# Patient Record
Sex: Female | Born: 1995 | Race: Black or African American | Hispanic: No | Marital: Single | Smoking: Current every day smoker
Health system: Southern US, Community
[De-identification: ages and names within clinical notes are randomized; demographics above are authoritative.]

## PROBLEM LIST (undated history)

## (undated) ENCOUNTER — Inpatient Hospital Stay (HOSPITAL_COMMUNITY): Payer: Self-pay

## (undated) ENCOUNTER — Ambulatory Visit: Payer: Commercial Managed Care - HMO | Source: Home / Self Care

## (undated) DIAGNOSIS — I10 Essential (primary) hypertension: Secondary | ICD-10-CM

## (undated) HISTORY — PX: WISDOM TOOTH EXTRACTION: SHX21

---
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
## 2013-06-01 ENCOUNTER — Emergency Department (HOSPITAL_COMMUNITY)
Admission: EM | Admit: 2013-06-01 | Discharge: 2013-06-01 | Disposition: A | Discharge status: Home / Self Care | Payer: Medicaid Other | Attending: Emergency Medicine | Admitting: Emergency Medicine

## 2013-06-01 ENCOUNTER — Encounter (HOSPITAL_COMMUNITY): Payer: Self-pay | Admitting: Emergency Medicine

## 2013-06-01 DIAGNOSIS — I10 Essential (primary) hypertension: Secondary | ICD-10-CM | POA: Insufficient documentation

## 2013-06-01 DIAGNOSIS — R0789 Other chest pain: Secondary | ICD-10-CM

## 2013-06-01 DIAGNOSIS — Y939 Activity, unspecified: Secondary | ICD-10-CM | POA: Insufficient documentation

## 2013-06-01 DIAGNOSIS — Z87891 Personal history of nicotine dependence: Secondary | ICD-10-CM | POA: Insufficient documentation

## 2013-06-01 DIAGNOSIS — T07XXXA Unspecified multiple injuries, initial encounter: Secondary | ICD-10-CM | POA: Insufficient documentation

## 2013-06-01 MED ORDER — CYCLOBENZAPRINE HCL 10 MG PO TABS: 10.0000 mg | ORAL_TABLET | Freq: Two times a day (BID) | ORAL | Status: DC | PRN

## 2013-06-01 MED ORDER — NAPROXEN 500 MG PO TABS: 500.0000 mg | ORAL_TABLET | Freq: Two times a day (BID) | ORAL | Status: DC

## 2013-06-01 NOTE — ED Notes (Signed)
Pt alert and oriented and talking on cell phone when this RN entered room.  Pt states she's in so much pain she couldn't even go to school today.  Pt ambulatory without difficulty or assistance.
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
## 2013-06-01 NOTE — Discharge Instructions (Signed)
Take Naprosyn as needed for pain. Take Flexeril as needed for muscle spasm. Refer to attached documents for more information. Return to the ED with worsening or concerning symptoms.  °

## 2013-06-02 NOTE — ED Provider Notes (Signed)
Medical screening examination/treatment/procedure(s) were performed by non-physician practitioner and as supervising physician I was immediately available for consultation/collaboration.   EKG Interpretation None        Layla MawKristen N Jamela Cumbo, DO 06/02/13 [phone]

## 2013-12-13 ENCOUNTER — Encounter (HOSPITAL_COMMUNITY): Payer: Self-pay | Admitting: Emergency Medicine

## 2014-02-11 NOTE — L&D Delivery Note (Signed)
Delivery Note  Patient with pregnancy complicated by cHTN (on asa prophylaxis), obesity, limited PNC and fLeft renal anomaly, presented at 9363w2d with PROM at 0620 12/03/14. She was not feeling contractions and underwent induction of labor with cytotec and FB placement. Labor was augmented with pitocin. She was dilated to completion at approximately 0015 on 12/04/14, and patient began pushing.   At 12:28 AM a viable and healthy female was delivered via Vaginal, Spontaneous Delivery (Presentation: Left Occiput Anterior).  APGAR: pending; weight 6 lb 15.8 oz (3170 g).   Placenta status: intact.  Cord: 3 vessels with the following complications: Loose nuchal cord.  Cord pH: N/A  Anesthesia: Epidural  Episiotomy: None Lacerations: None Suture Repair: None Est. Blood Loss (mL): 400  Mom to postpartum.  Baby to Couplet care / Skin to Skin.  Jamelle HaringHillary M Jaeline Whobrey, MD [facility], PGY-1 12/04/2014, 12:42 AM

## 2014-05-12 ENCOUNTER — Encounter (HOSPITAL_COMMUNITY): Payer: Self-pay | Admitting: Emergency Medicine

## 2014-05-12 ENCOUNTER — Emergency Department (HOSPITAL_COMMUNITY)
Admission: EM | Admit: 2014-05-12 | Discharge: 2014-05-12 | Disposition: A | Discharge status: Home / Self Care | Payer: Medicaid Other | Attending: Emergency Medicine | Admitting: Emergency Medicine

## 2014-05-12 DIAGNOSIS — I10 Essential (primary) hypertension: Secondary | ICD-10-CM | POA: Insufficient documentation

## 2014-05-12 DIAGNOSIS — R110 Nausea: Secondary | ICD-10-CM | POA: Diagnosis not present

## 2014-05-12 DIAGNOSIS — Z3202 Encounter for pregnancy test, result negative: Secondary | ICD-10-CM | POA: Diagnosis not present

## 2014-05-12 DIAGNOSIS — Z87891 Personal history of nicotine dependence: Secondary | ICD-10-CM | POA: Insufficient documentation

## 2014-05-12 DIAGNOSIS — N926 Irregular menstruation, unspecified: Secondary | ICD-10-CM | POA: Diagnosis not present

## 2014-05-12 LAB — URINALYSIS, ROUTINE W REFLEX MICROSCOPIC
BILIRUBIN URINE: NEGATIVE
Glucose, UA: NEGATIVE mg/dL
HGB URINE DIPSTICK: NEGATIVE
KETONES UR: NEGATIVE mg/dL
LEUKOCYTES UA: NEGATIVE
NITRITE: NEGATIVE
Protein, ur: NEGATIVE mg/dL
Specific Gravity, Urine: 1.014 (ref 1.005–1.030)
UROBILINOGEN UA: 0.2 mg/dL (ref 0.0–1.0)
pH: 7 (ref 5.0–8.0)

## 2014-05-12 LAB — POC URINE PREG, ED: Preg Test, Ur: NEGATIVE

## 2014-05-12 NOTE — ED Provider Notes (Signed)
CSN: [id number]     Arrival date & time 05/12/14  1958 History   First MD Initiated Contact with Patient 05/12/14 2141     Chief Complaint  Patient presents with  . Morning Sickness  . Possible Pregnancy     (Consider location/radiation/quality/duration/timing/severity/associated sxs/prior Treatment) HPI Alicia Simon is a 19 y.o. female with hx of htn, presents to ED with complaint of nausea, weakness, sensetivity to smell and taste, missed last two menstrual cycles. Pt states symptoms began several wks ago. States she thought she was pregnant, took preg test at home 2 wks ago and it was negative. Pt denies any fever or chills. No vomiting. No pelvic pain. No abdominal pain. No urinary symptoms. States last time she had these symptoms she ws pregnant. Pt denies any other complaints.    Past Medical History  Diagnosis Date  . Hypertension    Past Surgical History  Procedure Laterality Date  . Wisdom tooth extraction     History reviewed. No pertinent family history. History  Substance Use Topics  . Smoking status: Former Smoker -- .5 years    Types: Cigarettes  . Smokeless tobacco: Never Used  . Alcohol Use: No   OB History    Gravida Para Term Preterm AB TAB SAB Ectopic Multiple Living   0 0 0 0 0 0 1     Review of Systems  Constitutional: Negative for fever and chills.  Respiratory: Negative for cough, chest tightness and shortness of breath.   Cardiovascular: Negative for chest pain, palpitations and leg swelling.  Gastrointestinal: Positive for nausea. Negative for vomiting, abdominal pain and diarrhea.  Genitourinary: Positive for menstrual problem. Negative for dysuria, flank pain, vaginal bleeding, vaginal discharge, vaginal pain and pelvic pain.  Musculoskeletal: Negative for myalgias, arthralgias, neck pain and neck stiffness.  Skin: Negative for rash.  Neurological: Negative for dizziness, weakness and headaches.  All other systems reviewed and are  negative.     Allergies  Review of patient's allergies indicates no known allergies.  Home Medications   Prior to Admission medications   Medication Sig Start Date End Date Taking? Authorizing Provider  cyclobenzaprine (FLEXERIL) 10 MG tablet Take 1 tablet (10 mg total) by mouth 2 (two) times daily as needed for muscle spasms. Patient not taking: Reported on 05/12/2014 06/01/13   Emilia Beck, PA-C  naproxen (NAPROSYN) 500 MG tablet Take 1 tablet (500 mg total) by mouth 2 (two) times daily with a meal. Patient not taking: Reported on 05/12/2014 06/01/13   Emilia Beck, PA-C   BP 134/77 mmHg  Pulse 90  Temp(Src) 99 F (37.2 C)  Resp 18  SpO2 99%  LMP 02/14/2014 (Exact Date) Physical Exam  Constitutional: She is oriented to person, place, and time. She appears well-developed and well-nourished. No distress.  HENT:  Head: Normocephalic.  Eyes: Conjunctivae are normal.  Neck: Neck supple.  Cardiovascular: Normal rate, regular rhythm and normal heart sounds.   Pulmonary/Chest: Effort normal and breath sounds normal. No respiratory distress. She has no wheezes. She has no rales.  Abdominal: Soft. Bowel sounds are normal. She exhibits no distension. There is no tenderness. There is no rebound.  Musculoskeletal: She exhibits no edema.  Neurological: She is alert and oriented to person, place, and time.  Skin: Skin is warm and dry.  Psychiatric: She has a normal mood and affect. Her behavior is normal.  Nursing note and vitals reviewed.   ED Course  Procedures (including critical care time) Labs Review  Labs Reviewed  URINALYSIS, ROUTINE W REFLEX MICROSCOPIC  POC URINE PREG, ED  I-STAT BETA HCG BLOOD, ED ([facility], [facility], [facility] ONLY)    Imaging Review No results found.   EKG Interpretation None      MDM   Final diagnoses:  Irregular menses  Nausea   Pt is here for "second opinion because I think I am pregnant, but my pregnancy test at home was negative." Pt denies any  pain. Abdomen is benign. VS normal. Urine and urine preg both negative. Pt requesting blood test. Will get POC Hcg.   Pt decided she did not want to wait on hCg. Will d/c home.   Filed Vitals:   05/12/14 2014 05/12/14 2225  BP: 134/77 128/84  Pulse: 90 92  Temp: 99 F (37.2 C) 98.3 F (36.8 C)  TempSrc:  Oral  Resp: 18 16  SpO2: 99% 100%       Jaynie Crumbleatyana Jadalyn Oliveri, PA-C 05/13/14 0023  Linwood DibblesJon Knapp, MD 05/14/14 [phone]

## 2014-05-12 NOTE — Discharge Instructions (Signed)
Your urine pregnancy test here is negative. Please follow up with OB/GYN or primary care doctor of your choice for further evaluation.

## 2014-05-12 NOTE — ED Notes (Signed)
Pt refused blood draw

## 2014-05-12 NOTE — ED Notes (Signed)
Patient here with complaint of morning sickness, sensitivity to smells, and loss of period. Expresses feeling like she may be pregnant, but took a home pregnancy test 2 weeks ago and it was negative.

## 2014-07-10 ENCOUNTER — Emergency Department (HOSPITAL_COMMUNITY)
Admission: EM | Admit: 2014-07-10 | Discharge: 2014-07-11 | Disposition: A | Discharge status: Home / Self Care | Payer: No Typology Code available for payment source | Attending: Emergency Medicine | Admitting: Emergency Medicine

## 2014-07-10 ENCOUNTER — Encounter (HOSPITAL_COMMUNITY): Payer: Self-pay | Admitting: Emergency Medicine

## 2014-07-10 DIAGNOSIS — Z3A16 16 weeks gestation of pregnancy: Secondary | ICD-10-CM | POA: Insufficient documentation

## 2014-07-10 DIAGNOSIS — Y92830 Public park as the place of occurrence of the external cause: Secondary | ICD-10-CM | POA: Diagnosis not present

## 2014-07-10 DIAGNOSIS — F17210 Nicotine dependence, cigarettes, uncomplicated: Secondary | ICD-10-CM | POA: Diagnosis not present

## 2014-07-10 DIAGNOSIS — Z79899 Other long term (current) drug therapy: Secondary | ICD-10-CM | POA: Diagnosis not present

## 2014-07-10 DIAGNOSIS — S8002XA Contusion of left knee, initial encounter: Secondary | ICD-10-CM | POA: Diagnosis not present

## 2014-07-10 DIAGNOSIS — Y998 Other external cause status: Secondary | ICD-10-CM | POA: Insufficient documentation

## 2014-07-10 DIAGNOSIS — O9A212 Injury, poisoning and certain other consequences of external causes complicating pregnancy, second trimester: Secondary | ICD-10-CM | POA: Insufficient documentation

## 2014-07-10 DIAGNOSIS — S39012A Strain of muscle, fascia and tendon of lower back, initial encounter: Secondary | ICD-10-CM

## 2014-07-10 DIAGNOSIS — O10012 Pre-existing essential hypertension complicating pregnancy, second trimester: Secondary | ICD-10-CM | POA: Insufficient documentation

## 2014-07-10 DIAGNOSIS — S301XXA Contusion of abdominal wall, initial encounter: Secondary | ICD-10-CM | POA: Diagnosis not present

## 2014-07-10 DIAGNOSIS — Y9389 Activity, other specified: Secondary | ICD-10-CM | POA: Insufficient documentation

## 2014-07-10 DIAGNOSIS — O99332 Smoking (tobacco) complicating pregnancy, second trimester: Secondary | ICD-10-CM | POA: Diagnosis not present

## 2014-07-10 DIAGNOSIS — Z3490 Encounter for supervision of normal pregnancy, unspecified, unspecified trimester: Secondary | ICD-10-CM
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
## 2014-07-11 ENCOUNTER — Encounter (HOSPITAL_COMMUNITY): Payer: Self-pay | Admitting: *Deleted

## 2014-07-11 MED ORDER — ACETAMINOPHEN 325 MG PO TABS
650.0000 mg | ORAL_TABLET | Freq: Once | ORAL | Status: AC
  Administered 2014-07-11: 650 mg via ORAL
  Filled 2014-07-11: qty 2
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
## 2014-07-11 NOTE — Discharge Instructions (Signed)
Back Exercises Back exercises help treat and prevent back injuries. The goal is to increase your strength in your belly (abdominal) and back muscles. These exercises can also help with flexibility. Start these exercises when told by your doctor. HOME CARE Back exercises include: Pelvic Tilt.  Lie on your back with your knees bent. Tilt your pelvis until the lower part of your back is against the floor. Hold this position 5 to 10 sec. Repeat this exercise 5 to 10 times. Knee to Chest.  Pull 1 knee up against your chest and hold for 20 to 30 seconds. Repeat this with the other knee. This may be done with the other leg straight or bent, whichever feels better. Then, pull both knees up against your chest. Sit-Ups or Curl-Ups.  Bend your knees 90 degrees. Start with tilting your pelvis, and do a partial, slow sit-up. Only lift your upper half 30 to 45 degrees off the floor. Take at least 2 to 3 seonds for each sit-up. Do not do sit-ups with your knees out straight. If partial sit-ups are difficult, simply do the above but with only tightening your belly (abdominal) muscles and holding it as told. Hip-Lift.  Lie on your back with your knees flexed 90 degrees. Push down with your feet and shoulders as you raise your hips 2 inches off the floor. Hold for 10 seconds, repeat 5 to 10 times. Back Arches.  Lie on your stomach. Prop yourself up on bent elbows. Slowly press on your hands, causing an arch in your low back. Repeat 3 to 5 times. Shoulder-Lifts.  Lie face down with arms beside your body. Keep hips and belly pressed to floor as you slowly lift your head and shoulders off the floor. Do not overdo your exercises. Be careful in the beginning. Exercises may cause you some mild back discomfort. If the pain lasts for more than 15 minutes, stop the exercises until you see your doctor. Improvement with exercise for back problems is slow.  Document Released: 03/02/2010 Document Revised: 04/22/2011  Document Reviewed: 11/29/2010 [facility] Patient Information 2015 [employer] [place]. This information is not intended to replace advice given to you by your health care provider. Make sure you discuss any questions you have with your health care provider. He can safely take Tylenol for your discomfort in your pregnant during her pregnancy.  [facility]  Please make an appointment to start her OB/GYN care as soon as possible if he develops new or worsening symptoms in your abdomen.  Develop vaginal bleeding or discharge.  [facility]

## 2014-07-11 NOTE — ED Notes (Signed)
FHT verified with bedside [place]

## 2014-07-19 ENCOUNTER — Inpatient Hospital Stay (HOSPITAL_COMMUNITY): Payer: Medicaid Other

## 2014-07-19 ENCOUNTER — Encounter (HOSPITAL_COMMUNITY): Payer: Self-pay | Admitting: *Deleted

## 2014-07-19 ENCOUNTER — Inpatient Hospital Stay (HOSPITAL_COMMUNITY)
Admission: AD | Admit: 2014-07-19 | Discharge: 2014-07-19 | Disposition: A | Discharge status: Home / Self Care | Payer: Medicaid Other | Source: Ambulatory Visit | Attending: Family Medicine | Admitting: Family Medicine

## 2014-07-19 DIAGNOSIS — R109 Unspecified abdominal pain: Secondary | ICD-10-CM | POA: Insufficient documentation

## 2014-07-19 DIAGNOSIS — O9989 Other specified diseases and conditions complicating pregnancy, childbirth and the puerperium: Secondary | ICD-10-CM | POA: Insufficient documentation

## 2014-07-19 DIAGNOSIS — Z3A00 Weeks of gestation of pregnancy not specified: Secondary | ICD-10-CM | POA: Diagnosis not present

## 2014-07-19 DIAGNOSIS — O99330 Smoking (tobacco) complicating pregnancy, unspecified trimester: Secondary | ICD-10-CM | POA: Insufficient documentation

## 2014-07-19 DIAGNOSIS — O26899 Other specified pregnancy related conditions, unspecified trimester: Secondary | ICD-10-CM

## 2014-07-19 DIAGNOSIS — F17210 Nicotine dependence, cigarettes, uncomplicated: Secondary | ICD-10-CM | POA: Diagnosis not present

## 2014-07-19 LAB — URINALYSIS, ROUTINE W REFLEX MICROSCOPIC
BILIRUBIN URINE: NEGATIVE
GLUCOSE, UA: NEGATIVE mg/dL
Hgb urine dipstick: NEGATIVE
Ketones, ur: NEGATIVE mg/dL
LEUKOCYTES UA: NEGATIVE
Nitrite: NEGATIVE
PROTEIN: NEGATIVE mg/dL
Specific Gravity, Urine: 1.02 (ref 1.005–1.030)
Urobilinogen, UA: 0.2 mg/dL (ref 0.0–1.0)
pH: 6 (ref 5.0–8.0)

## 2014-07-19 LAB — WET PREP, GENITAL
TRICH WET PREP: NONE SEEN
Yeast Wet Prep HPF POC: NONE SEEN

## 2014-07-19 LAB — CBC
HCT: 32.1 % — ABNORMAL LOW (ref 36.0–46.0)
HEMOGLOBIN: 10.7 g/dL — AB (ref 12.0–15.0)
MCH: 27.3 pg (ref 26.0–34.0)
MCHC: 33.3 g/dL (ref 30.0–36.0)
MCV: 81.9 fL (ref 78.0–100.0)
Platelets: 226 10*3/uL (ref 150–400)
RBC: 3.92 MIL/uL (ref 3.87–5.11)
RDW: 16.5 % — ABNORMAL HIGH (ref 11.5–15.5)
WBC: 6.5 10*3/uL (ref 4.0–10.5)

## 2014-07-19 LAB — GC/CHLAMYDIA PROBE AMP (~~LOC~~) NOT AT ARMC
CHLAMYDIA, DNA PROBE: NEGATIVE
Neisseria Gonorrhea: NEGATIVE

## 2014-07-19 LAB — HIV ANTIBODY (ROUTINE TESTING W REFLEX): HIV Screen 4th Generation wRfx: NONREACTIVE

## 2014-07-19 LAB — POCT PREGNANCY, URINE: Preg Test, Ur: POSITIVE — AB

## 2014-07-19 LAB — HCG, QUANTITATIVE, PREGNANCY: HCG, BETA CHAIN, QUANT, S: 8499 m[IU]/mL — AB (ref <5)

## 2014-07-19 NOTE — MAU Provider Note (Signed)
History     CSN: [id number]  Arrival date and time: 07/19/14 0003   First Provider Initiated Contact with Patient 07/19/14 0101      No chief complaint on file.  HPI Comments: Alicia Simon is a 19 y.o. G2P1001 at Unknown who presents today with abdominal pain x 1 week. She states that she had a [facility] that was [facility], and then several weeks later she had a + at the [facility]. She denies any VB. She has an appointment in the clinic on 07/26/14 to start [facility].   Abdominal Pain This is a new problem. The current episode started in the past 7 days. The onset quality is gradual. The problem occurs constantly. The problem has been unchanged. The pain is located in the generalized abdominal region. The pain is at a severity of 8/10. The quality of the pain is cramping. The abdominal pain does not radiate. Pertinent negatives include no constipation, diarrhea, dysuria, fever, frequency, nausea or vomiting. Nothing aggravates the pain. The pain is relieved by nothing. She has tried nothing for the symptoms.     Past Medical History  Diagnosis Date  . Hypertension     Past Surgical History  Procedure Laterality Date  . Wisdom tooth extraction      History reviewed. No pertinent family history.  History  Substance Use Topics  . Smoking status: Current Every Day Smoker -- .5 years    Types: Cigarettes  . Smokeless tobacco: Never Used  . Alcohol Use: No    Allergies: No Known Allergies  Prescriptions prior to admission  Medication Sig Dispense Refill Last Dose  . Prenatal Vit-Fe Fumarate-FA (PRENATAL MULTIVITAMIN) TABS tablet Take 1 tablet by mouth daily at 12 noon.   07/18/2014 at Unknown time  . cyclobenzaprine (FLEXERIL) 10 MG tablet Take 1 tablet (10 mg total) by mouth 2 (two) times daily as needed for muscle spasms. (Patient not taking: Reported on 05/12/2014) 8 tablet 0 Not Taking at Unknown time  . naproxen (NAPROSYN) 500 MG tablet Take 1 tablet (500 mg total)  by mouth 2 (two) times daily with a meal. (Patient not taking: Reported on 05/12/2014) 30 tablet 0 Not Taking at Unknown time    Review of Systems  Constitutional: Negative for fever.  Gastrointestinal: Positive for abdominal pain. Negative for nausea, vomiting, diarrhea and constipation.  Genitourinary: Negative for dysuria, urgency and frequency.   Physical Exam   Blood pressure 136/75, pulse 97, temperature 98.6 F (37 C), temperature source Oral, resp. rate 18, height 5' 3.5" (1.613 m), weight 119.75 kg (264 lb), last menstrual period 02/14/2014, SpO2 100 %.  Physical Exam  Nursing note and vitals reviewed. Constitutional: She is oriented to person, place, and time. She appears well-developed and well-nourished. No distress.  Cardiovascular: Normal rate.   Respiratory: Effort normal.  GI: Soft. There is no tenderness. There is no rebound.  Genitourinary:  External: no lesion Vagina: small amount of white discharge Cervix: pink, smooth, no CMT Uterus: NSSC Adnexa: NT   Neurological: She is oriented to person, place, and time.  Skin: Skin is warm and dry.  Psychiatric: She has a normal mood and affect.   Results for orders placed or performed during the hospital encounter of 07/19/14 (from the past 24 hour(s))  Urinalysis, Routine w reflex microscopic (not at [facility])     Status: None   Collection Time: 07/19/14 12:20 AM  Result Value Ref Range   Color, Urine YELLOW YELLOW   APPearance CLEAR  CLEAR   Specific Gravity, Urine 1.020 1.005 - 1.030   pH 6.0 5.0 - 8.0   Glucose, UA NEGATIVE NEGATIVE mg/dL   Hgb urine dipstick NEGATIVE NEGATIVE   Bilirubin Urine NEGATIVE NEGATIVE   Ketones, ur NEGATIVE NEGATIVE mg/dL   Protein, ur NEGATIVE NEGATIVE mg/dL   Urobilinogen, UA 0.2 0.0 - 1.0 mg/dL   Nitrite NEGATIVE NEGATIVE   Leukocytes, UA NEGATIVE NEGATIVE  Pregnancy, urine POC     Status: Abnormal   Collection Time: 07/19/14 12:45 AM  Result Value Ref Range   Preg Test, Ur  POSITIVE (A) NEGATIVE  CBC     Status: Abnormal   Collection Time: 07/19/14  1:09 AM  Result Value Ref Range   WBC 6.5 4.0 - 10.5 K/uL   RBC 3.92 3.87 - 5.11 MIL/uL   Hemoglobin 10.7 (L) 12.0 - 15.0 g/dL   HCT 82.932.1 (L) 56.236.0 - 13.046.0 %   MCV 81.9 78.0 - 100.0 fL   MCH 27.3 26.0 - 34.0 pg   MCHC 33.3 30.0 - 36.0 g/dL   RDW 86.516.5 (H) 78.411.5 - 69.615.5 %   Platelets 226 150 - 400 K/uL  Wet prep, genital     Status: Abnormal   Collection Time: 07/19/14  1:10 AM  Result Value Ref Range   Yeast Wet Prep HPF POC NONE SEEN NONE SEEN   Trich, Wet Prep NONE SEEN NONE SEEN   Clue Cells Wet Prep HPF POC FEW (A) NONE SEEN   WBC, Wet Prep HPF POC MODERATE (A) NONE SEEN   US: 19 weeks 0 days EDD 12/13/14 MAU Course  Procedures  MDM    Assessment and Plan   1. Abdominal pain affecting pregnancy    DC home Comfort measures reviewed  2nd Trimester precautions  PTL precautions  Fetal kick counts RX: none  Return to MAU as needed FU with OB as planned  Follow-up Information    [facility]   Specialty:  Obstetrics and Gynecology   Why:  As scheduled   Contact information:   [address] [place] [id number] [phone]        Tawnya CrookHogan, Heather Donovan 07/19/2014, 1:02 AM

## 2014-07-19 NOTE — MAU Note (Signed)
PT  SAYS SHE NL  HAS IRREG  CYCLE.   SHE HAD SPOTTING  IN FEB  X2 .   NO BIRTH  CONTROL.   LAST   SEX-   2 MTHS  AGO.    SAYS SHE HAS PAIN IN LOWER AND UPPER  ABD -   STARTED    1 WEEK AGO - BUT  NOW  COMES  MORE  OFTEN.     NO MEDS  FOR  PAIN.  PLANS TO  HAVE PNC  HERE IN  CLINIC.Marland Kitchen.    NO VAG  BLEEDING

## 2014-07-19 NOTE — Discharge Instructions (Signed)
Second Trimester of Pregnancy The second trimester is from week 13 through week 28, months 4 through 6. The second trimester is often a time when you feel your best. Your body has also adjusted to being pregnant, and you begin to feel better physically. Usually, morning sickness has lessened or quit completely, you may have more energy, and you may have an increase in appetite. The second trimester is also a time when the fetus is growing rapidly. At the end of the sixth month, the fetus is about 9 inches long and weighs about 1 pounds. You will likely begin to feel the baby move (quickening) between 18 and 20 weeks of the pregnancy. BODY CHANGES Your body goes through many changes during pregnancy. The changes vary from woman to woman.   Your weight will continue to increase. You will notice your lower abdomen bulging out.  You may begin to get stretch marks on your hips, abdomen, and breasts.  You may develop headaches that can be relieved by medicines approved by your health care provider.  You may urinate more often because the fetus is pressing on your bladder.  You may develop or continue to have heartburn as a result of your pregnancy.  You may develop constipation because certain hormones are causing the muscles that push waste through your intestines to slow down.  You may develop hemorrhoids or swollen, bulging veins (varicose veins).  You may have back pain because of the weight gain and pregnancy hormones relaxing your joints between the bones in your pelvis and as a result of a shift in weight and the muscles that support your balance.  Your breasts will continue to grow and be tender.  Your gums may bleed and may be sensitive to brushing and flossing.  Dark spots or blotches (chloasma, mask of pregnancy) may develop on your face. This will likely fade after the baby is born.  A dark line from your belly button to the pubic area (linea nigra) may appear. This will likely fade  after the baby is born.  You may have changes in your hair. These can include thickening of your hair, rapid growth, and changes in texture. Some women also have hair loss during or after pregnancy, or hair that feels dry or thin. Your hair will most likely return to normal after your baby is born. WHAT TO EXPECT AT YOUR PRENATAL VISITS During a routine prenatal visit:  You will be weighed to make sure you and the fetus are growing normally.  Your blood pressure will be taken.  Your abdomen will be measured to track your baby's growth.  The fetal heartbeat will be listened to.  Any test results from the previous visit will be discussed. Your health care provider may ask you:  How you are feeling.  If you are feeling the baby move.  If you have had any abnormal symptoms, such as leaking fluid, bleeding, severe headaches, or abdominal cramping.  If you have any questions. Other tests that may be performed during your second trimester include:  Blood tests that check for:  Low iron levels (anemia).  Gestational diabetes (between 24 and 28 weeks).  Rh antibodies.  Urine tests to check for infections, diabetes, or protein in the urine.  An ultrasound to confirm the proper growth and development of the baby.  An amniocentesis to check for possible genetic problems.  Fetal screens for spina bifida and Down syndrome. HOME CARE INSTRUCTIONS   Avoid all smoking, herbs, alcohol, and unprescribed   drugs. These chemicals affect the formation and growth of the baby.  Follow your health care provider's instructions regarding medicine use. There are medicines that are either safe or unsafe to take during pregnancy.  Exercise only as directed by your health care provider. Experiencing uterine cramps is a good sign to stop exercising.  Continue to eat regular, healthy meals.  Wear a good support bra for breast tenderness.  Do not use hot tubs, steam rooms, or saunas.  Wear your  seat belt at all times when driving.  Avoid raw meat, uncooked cheese, cat litter boxes, and soil used by cats. These carry germs that can cause birth defects in the baby.  Take your prenatal vitamins.  Try taking a stool softener (if your health care provider approves) if you develop constipation. Eat more high-fiber foods, such as fresh vegetables or fruit and whole grains. Drink plenty of fluids to keep your urine clear or pale yellow.  Take warm sitz baths to soothe any pain or discomfort caused by hemorrhoids. Use hemorrhoid cream if your health care provider approves.  If you develop varicose veins, wear support hose. Elevate your feet for 15 minutes, 3-4 times a day. Limit salt in your diet.  Avoid heavy lifting, wear low heel shoes, and practice good posture.  Rest with your legs elevated if you have leg cramps or low back pain.  Visit your dentist if you have not gone yet during your pregnancy. Use a soft toothbrush to brush your teeth and be gentle when you floss.  A sexual relationship may be continued unless your health care provider directs you otherwise.  Continue to go to all your prenatal visits as directed by your health care provider. SEEK MEDICAL CARE IF:   You have dizziness.  You have mild pelvic cramps, pelvic pressure, or nagging pain in the abdominal area.  You have persistent nausea, vomiting, or diarrhea.  You have a bad smelling vaginal discharge.  You have pain with urination. SEEK IMMEDIATE MEDICAL CARE IF:   You have a fever.  You are leaking fluid from your vagina.  You have spotting or bleeding from your vagina.  You have severe abdominal cramping or pain.  You have rapid weight gain or loss.  You have shortness of breath with chest pain.  You notice sudden or extreme swelling of your face, hands, ankles, feet, or legs.  You have not felt your baby move in over an hour.  You have severe headaches that do not go away with  medicine.  You have vision changes. Document Released: 01/22/2001 Document Revised: 02/02/2013 Document Reviewed: 03/31/2012 ExitCare Patient Information 2015 [employer]. This information is not intended to replace advice given to you by your health care provider. Make sure you discuss any questions you have with your health care provider.  

## 2014-07-19 NOTE — MAU Note (Signed)
Pt reports she has been having upper and lower abd pain , mostly in the upper part for 2 weeks. Has appointment in the clinic next week.  Denies bleeding, dysuria.

## 2014-07-26 ENCOUNTER — Ambulatory Visit (INDEPENDENT_AMBULATORY_CARE_PROVIDER_SITE_OTHER): Payer: Self-pay | Admitting: Obstetrics & Gynecology

## 2014-07-26 ENCOUNTER — Encounter: Payer: Self-pay | Admitting: Family Medicine

## 2014-07-26 VITALS — BP 132/67 | HR 99 | Temp 98.0°F | Wt 261.4 lb

## 2014-07-26 DIAGNOSIS — Z113 Encounter for screening for infections with a predominantly sexual mode of transmission: Secondary | ICD-10-CM

## 2014-07-26 DIAGNOSIS — E669 Obesity, unspecified: Secondary | ICD-10-CM

## 2014-07-26 DIAGNOSIS — O10912 Unspecified pre-existing hypertension complicating pregnancy, second trimester: Secondary | ICD-10-CM

## 2014-07-26 DIAGNOSIS — Z118 Encounter for screening for other infectious and parasitic diseases: Secondary | ICD-10-CM

## 2014-07-26 DIAGNOSIS — O0990 Supervision of high risk pregnancy, unspecified, unspecified trimester: Secondary | ICD-10-CM | POA: Insufficient documentation

## 2014-07-26 DIAGNOSIS — O0992 Supervision of high risk pregnancy, unspecified, second trimester: Secondary | ICD-10-CM

## 2014-07-26 DIAGNOSIS — Z124 Encounter for screening for malignant neoplasm of cervix: Secondary | ICD-10-CM

## 2014-07-26 DIAGNOSIS — O99212 Obesity complicating pregnancy, second trimester: Secondary | ICD-10-CM

## 2014-07-26 DIAGNOSIS — O99210 Obesity complicating pregnancy, unspecified trimester: Secondary | ICD-10-CM | POA: Insufficient documentation

## 2014-07-26 LAB — POCT URINALYSIS DIP (DEVICE)
Bilirubin Urine: NEGATIVE
GLUCOSE, UA: NEGATIVE mg/dL
Hgb urine dipstick: NEGATIVE
Ketones, ur: NEGATIVE mg/dL
LEUKOCYTES UA: NEGATIVE
NITRITE: NEGATIVE
Protein, ur: NEGATIVE mg/dL
Specific Gravity, Urine: 1.02 (ref 1.005–1.030)
UROBILINOGEN UA: 0.2 mg/dL (ref 0.0–1.0)
pH: 8.5 — ABNORMAL HIGH (ref 5.0–8.0)

## 2014-07-26 LAB — COMPREHENSIVE METABOLIC PANEL
ALBUMIN: 3.3 g/dL — AB (ref 3.5–5.2)
ALT: 20 U/L (ref 0–35)
AST: 18 U/L (ref 0–37)
Alkaline Phosphatase: 56 U/L (ref 39–117)
BUN: 2 mg/dL — AB (ref 6–23)
CALCIUM: 8.5 mg/dL (ref 8.4–10.5)
CHLORIDE: 102 meq/L (ref 96–112)
CO2: 21 meq/L (ref 19–32)
CREATININE: 0.51 mg/dL (ref 0.50–1.10)
Glucose, Bld: 130 mg/dL — ABNORMAL HIGH (ref 70–99)
Potassium: 3.7 mEq/L (ref 3.5–5.3)
Sodium: 132 mEq/L — ABNORMAL LOW (ref 135–145)
Total Bilirubin: 0.2 mg/dL (ref 0.2–1.2)
Total Protein: 6.2 g/dL (ref 6.0–8.3)

## 2014-07-26 MED ORDER — ASPIRIN EC 81 MG PO TBEC: 81.0000 mg | DELAYED_RELEASE_TABLET | Freq: Every day | ORAL | Status: DC

## 2014-07-26 NOTE — Progress Notes (Signed)
Subjective:    Alicia Simon is a 19 y.o. G2P1001 at [redacted]w[redacted]d being seen today for her first obstetrical visit.  Her obstetrical history is significant for gestational hypertension. Patient had DBP of 90 during her limited ultrasound scan last week at 19 weeks, no BP taken in the system outside pregnancy (after flowsheet review; had some severe range BP during her labor and delivery course in 2013).  She had no PCP visits outside pregnancy.  Patient does intend to breast feed. Pregnancy history fully reviewed.  Patient reports no complaints today.  Filed Vitals:   07/26/14 1356  BP: 132/67  Pulse: 99  Temp: 98 F (36.7 C)  Weight: 261 lb 6.4 oz (118.57 kg)   BP Readings from Last 2 Encounters:  07/26/14 132/67  07/19/14 135/90   HISTORY: OB History  Gravida Para Term Preterm AB SAB TAB Ectopic Multiple Living  0 0 0 0 0 0 1    # Outcome Date GA Lbr Len/2nd Weight Sex Delivery Anes PTL Lv  2 Current           1 Term 04/20/11 [redacted]w[redacted]d 25:52 / 00:45 6 lb 12.3 oz (3.07 kg) M Vag-Vacuum EPI  Y     Comments: wnl     Past Medical History  Diagnosis Date  . Hypertension    Past Surgical History  Procedure Laterality Date  . Wisdom tooth extraction     History reviewed. No pertinent family history.   Exam    Uterus:  Fundal Height: 20 cm  Pelvic Exam:    Perineum: No Hemorrhoids, Normal Perineum   Vulva: normal   Vagina:  normal mucosa, normal discharge   Cervix: cervical motion tenderness, no bleeding following Pap and no cervical motion tenderness   Adnexa: normal adnexa and no mass, fullness, tenderness   Bony Pelvis: average  System: Breast:  normal appearance, no masses or tenderness, pendulous   Skin: normal coloration and turgor, no rashes   Neurologic: oriented, normal, negative   Extremities: normal strength, tone, and muscle mass, no deformities   HEENT PERRLA, extra ocular movement intact and sclera clear, anicteric   Mouth/Teeth mucous membranes  moist, pharynx normal without lesions and dental hygiene good   Neck supple and no masses   Cardiovascular: regular rate and rhythm   Respiratory:  appears well, vitals normal, no respiratory distress, acyanotic, normal RR, chest clear, no wheezing, crepitations, rhonchi, normal symmetric air entry   Abdomen: soft, non-tender; bowel sounds normal; no masses,  no organomegaly, obese   Urinary: urethral meatus normal      Assessment:    Pregnancy: G2P1001 at [redacted]w[redacted]d Patient Active Problem List   Diagnosis Date Noted  . Supervision of high-risk pregnancy 07/26/2014  . Obesity in pregnancy, antepartum 07/26/2014  . Preexisting hypertension complicating pregnancy, antepartum 05/06/2011     Plan:   1. Preexisting hypertension complicating pregnancy, antepartum, second trimester  Baseline labs (CBC, CMP, urine Pr:Cr)  Aspirin 81 mg daily prescribed after 12 weeks  Serial growth scans 20-24-28-32-35-38 - Anatomy scan ordered  Antenatal testing starting at 32 weeks  Delivery by 39 weeks (with meds, 40 with no meds) or earlier if needed Discussed implications of CHTN in pregnancy, need for antenatal testing and frequent ultrasounds/prenatal visits, need for optimizing BP control to decrease CHTN/preeclampsia associated maternal-fetal morbidity and mortality. Will check baseline labs today.  2. Supervision of high-risk pregnancy, second trimester - Prenatal Profile - Prescription Monitoring Profile(19) - Culture,  OB Urine - US OB Comp + 14 Wk; Future - Cytology - PAP - Comprehensive metabolic panel - Protein:Creatinine ratio, urine - TSH - Glucose Tolerance, 1hr (50g) - Hemoglobinopathy evaluation - AFP, Quad Screen - Aspirin EC 81 MG tablet; Take 1 tablet (81 mg total) by mouth daily. Take after 12 weeks for prevention of preeclampsia later in pregnancy  Dispense: 300 tablet; Refill: 2 - Prenatal vitamins - Ultrasound discussed; fetal survey: ordered.  3. Obesity in  pregnancy, antepartum, second trimester Will observe weight, Nutrition consult to be done this pregnancy  Problem list reviewed and updated. The nature of [facility] with multiple MDs and other Advanced Practice Providers was explained to patient; also emphasized that residents, students are part of our team. Follow up in 4 weeks.  Routine obstetric precautions reviewed.   Tereso Newcomer, MD 07/26/2014

## 2014-07-26 NOTE — Progress Notes (Signed)
Initial labs and early 1hr gtt today.  C/o intermittent pelvic pain.

## 2014-07-26 NOTE — Patient Instructions (Signed)
Second Trimester of Pregnancy The second trimester is from week 13 through week 28, months 4 through 6. The second trimester is often a time when you feel your best. Your body has also adjusted to being pregnant, and you begin to feel better physically. Usually, morning sickness has lessened or quit completely, you may have more energy, and you may have an increase in appetite. The second trimester is also a time when the fetus is growing rapidly. At the end of the sixth month, the fetus is about 9 inches long and weighs about 1 pounds. You will likely begin to feel the baby move (quickening) between 18 and 20 weeks of the pregnancy. BODY CHANGES Your body goes through many changes during pregnancy. The changes vary from woman to woman.   Your weight will continue to increase. You will notice your lower abdomen bulging out.  You may begin to get stretch marks on your hips, abdomen, and breasts.  You may develop headaches that can be relieved by medicines approved by your health care provider.  You may urinate more often because the fetus is pressing on your bladder.  You may develop or continue to have heartburn as a result of your pregnancy.  You may develop constipation because certain hormones are causing the muscles that push waste through your intestines to slow down.  You may develop hemorrhoids or swollen, bulging veins (varicose veins).  You may have back pain because of the weight gain and pregnancy hormones relaxing your joints between the bones in your pelvis and as a result of a shift in weight and the muscles that support your balance.  Your breasts will continue to grow and be tender.  Your gums may bleed and may be sensitive to brushing and flossing.  Dark spots or blotches (chloasma, mask of pregnancy) may develop on your face. This will likely fade after the baby is born.  A dark line from your belly button to the pubic area (linea nigra) may appear. This will likely fade  after the baby is born.  You may have changes in your hair. These can include thickening of your hair, rapid growth, and changes in texture. Some women also have hair loss during or after pregnancy, or hair that feels dry or thin. Your hair will most likely return to normal after your baby is born. WHAT TO EXPECT AT YOUR PRENATAL VISITS During a routine prenatal visit:  You will be weighed to make sure you and the fetus are growing normally.  Your blood pressure will be taken.  Your abdomen will be measured to track your baby's growth.  The fetal heartbeat will be listened to.  Any test results from the previous visit will be discussed. Your health care provider may ask you:  How you are feeling.  If you are feeling the baby move.  If you have had any abnormal symptoms, such as leaking fluid, bleeding, severe headaches, or abdominal cramping.  If you have any questions. Other tests that may be performed during your second trimester include:  Blood tests that check for:  Low iron levels (anemia).  Gestational diabetes (between 24 and 28 weeks).  Rh antibodies.  Urine tests to check for infections, diabetes, or protein in the urine.  An ultrasound to confirm the proper growth and development of the baby.  An amniocentesis to check for possible genetic problems.  Fetal screens for spina bifida and Down syndrome. HOME CARE INSTRUCTIONS   Avoid all smoking, herbs, alcohol, and unprescribed   drugs. These chemicals affect the formation and growth of the baby.  Follow your health care provider's instructions regarding medicine use. There are medicines that are either safe or unsafe to take during pregnancy.  Exercise only as directed by your health care provider. Experiencing uterine cramps is a good sign to stop exercising.  Continue to eat regular, healthy meals.  Wear a good support bra for breast tenderness.  Do not use hot tubs, steam rooms, or saunas.  Wear your  seat belt at all times when driving.  Avoid raw meat, uncooked cheese, cat litter boxes, and soil used by cats. These carry germs that can cause birth defects in the baby.  Take your prenatal vitamins.  Try taking a stool softener (if your health care provider approves) if you develop constipation. Eat more high-fiber foods, such as fresh vegetables or fruit and whole grains. Drink plenty of fluids to keep your urine clear or pale yellow.  Take warm sitz baths to soothe any pain or discomfort caused by hemorrhoids. Use hemorrhoid cream if your health care provider approves.  If you develop varicose veins, wear support hose. Elevate your feet for 15 minutes, 3-4 times a day. Limit salt in your diet.  Avoid heavy lifting, wear low heel shoes, and practice good posture.  Rest with your legs elevated if you have leg cramps or low back pain.  Visit your dentist if you have not gone yet during your pregnancy. Use a soft toothbrush to brush your teeth and be gentle when you floss.  A sexual relationship may be continued unless your health care provider directs you otherwise.  Continue to go to all your prenatal visits as directed by your health care provider. SEEK MEDICAL CARE IF:   You have dizziness.  You have mild pelvic cramps, pelvic pressure, or nagging pain in the abdominal area.  You have persistent nausea, vomiting, or diarrhea.  You have a bad smelling vaginal discharge.  You have pain with urination. SEEK IMMEDIATE MEDICAL CARE IF:   You have a fever.  You are leaking fluid from your vagina.  You have spotting or bleeding from your vagina.  You have severe abdominal cramping or pain.  You have rapid weight gain or loss.  You have shortness of breath with chest pain.  You notice sudden or extreme swelling of your face, hands, ankles, feet, or legs.  You have not felt your baby move in over an hour.  You have severe headaches that do not go away with  medicine.  You have vision changes. Document Released: 01/22/2001 Document Revised: 02/02/2013 Document Reviewed: 03/31/2012 Exit[employer] Patient Information 2015 [employer]. This information is not intended to replace advice given to you by your health care provider. Make sure you discuss any questions you have with your health care provider.  

## 2014-07-27 ENCOUNTER — Ambulatory Visit (HOSPITAL_COMMUNITY): Payer: No Typology Code available for payment source

## 2014-07-27 LAB — PRENATAL PROFILE (SOLSTAS)
Antibody Screen: NEGATIVE
Basophils Absolute: 0 10*3/uL (ref 0.0–0.1)
Basophils Relative: 0 % (ref 0–1)
Eosinophils Absolute: 0 10*3/uL (ref 0.0–0.7)
Eosinophils Relative: 1 % (ref 0–5)
HEMATOCRIT: 31.9 % — AB (ref 36.0–46.0)
HEP B S AG: NEGATIVE
HIV: NONREACTIVE
Hemoglobin: 10.2 g/dL — ABNORMAL LOW (ref 12.0–15.0)
LYMPHS ABS: 1.1 10*3/uL (ref 0.7–4.0)
Lymphocytes Relative: 26 % (ref 12–46)
MCH: 26 pg (ref 26.0–34.0)
MCHC: 32 g/dL (ref 30.0–36.0)
MCV: 81.4 fL (ref 78.0–100.0)
MONO ABS: 0.4 10*3/uL (ref 0.1–1.0)
MONOS PCT: 10 % (ref 3–12)
MPV: 9.5 fL (ref 8.6–12.4)
NEUTROS ABS: 2.6 10*3/uL (ref 1.7–7.7)
NEUTROS PCT: 63 % (ref 43–77)
PLATELETS: 265 10*3/uL (ref 150–400)
RBC: 3.92 MIL/uL (ref 3.87–5.11)
RDW: 17.9 % — ABNORMAL HIGH (ref 11.5–15.5)
RH TYPE: POSITIVE
RUBELLA: 1.74 {index} — AB (ref <0.90)
WBC: 4.2 10*3/uL (ref 4.0–10.5)

## 2014-07-27 LAB — TSH: TSH: 3.189 u[IU]/mL (ref 0.350–4.500)

## 2014-07-27 LAB — PROTEIN / CREATININE RATIO, URINE
Creatinine, Urine: 126.5 mg/dL
PROTEIN CREATININE RATIO: 0.11 (ref <0.15)
TOTAL PROTEIN, URINE: 14 mg/dL (ref 5–24)

## 2014-07-27 LAB — GLUCOSE TOLERANCE, 1 HOUR (50G) W/O FASTING: Glucose, 1 Hour GTT: 133 mg/dL (ref 70–140)

## 2014-07-28 LAB — PRESCRIPTION MONITORING PROFILE (19 PANEL)
AMPHETAMINE/METH: NEGATIVE ng/mL
Barbiturate Screen, Urine: NEGATIVE ng/mL
Benzodiazepine Screen, Urine: NEGATIVE ng/mL
Buprenorphine, Urine: NEGATIVE ng/mL
CREATININE, URINE: 119.55 mg/dL (ref >20.0)
Cannabinoid Scrn, Ur: NEGATIVE ng/mL
Carisoprodol, Urine: NEGATIVE ng/mL
Cocaine Metabolites: NEGATIVE ng/mL
ECSTASY: NEGATIVE ng/mL
FENTANYL URINE: NEGATIVE ng/mL
MEPERIDINE UR: NEGATIVE ng/mL
METHAQUALONE SCREEN (URINE): NEGATIVE ng/mL
Methadone Screen, Urine: NEGATIVE ng/mL
NITRITES URINE, INITIAL: NEGATIVE ug/mL
OPIATE SCREEN, URINE: NEGATIVE ng/mL
Oxycodone Screen, Ur: NEGATIVE ng/mL
Phencyclidine, Ur: NEGATIVE ng/mL
Propoxyphene: NEGATIVE ng/mL
Tapentadol, urine: NEGATIVE ng/mL
Tramadol Scrn, Ur: NEGATIVE ng/mL
ZOLPIDEM, URINE: NEGATIVE ng/mL
pH, Initial: 8.2 pH (ref 4.5–8.9)

## 2014-07-28 LAB — CULTURE, OB URINE
Colony Count: NO GROWTH
Organism ID, Bacteria: NO GROWTH

## 2014-07-28 LAB — HEMOGLOBINOPATHY EVALUATION
HEMOGLOBIN OTHER: 0 %
HGB A: 97.5 % (ref 96.8–97.8)
HGB F QUANT: 0 % (ref 0.0–2.0)
HGB S QUANTITAION: 0 %
Hgb A2 Quant: 2.5 % (ref 2.2–3.2)

## 2014-07-29 LAB — CYTOLOGY - PAP

## 2014-08-01 ENCOUNTER — Other Ambulatory Visit: Payer: Self-pay | Admitting: Obstetrics & Gynecology

## 2014-08-01 ENCOUNTER — Ambulatory Visit (HOSPITAL_COMMUNITY)
Admission: RE | Admit: 2014-08-01 | Discharge: 2014-08-01 | Disposition: A | Discharge status: Home / Self Care | Payer: Medicaid Other | Source: Ambulatory Visit | Attending: Obstetrics & Gynecology | Admitting: Obstetrics & Gynecology

## 2014-08-01 DIAGNOSIS — O283 Abnormal ultrasonic finding on antenatal screening of mother: Secondary | ICD-10-CM | POA: Diagnosis not present

## 2014-08-01 DIAGNOSIS — O0992 Supervision of high risk pregnancy, unspecified, second trimester: Secondary | ICD-10-CM

## 2014-08-01 DIAGNOSIS — Z3689 Encounter for other specified antenatal screening: Secondary | ICD-10-CM | POA: Insufficient documentation

## 2014-08-01 DIAGNOSIS — O99212 Obesity complicating pregnancy, second trimester: Secondary | ICD-10-CM

## 2014-08-01 DIAGNOSIS — O10912 Unspecified pre-existing hypertension complicating pregnancy, second trimester: Secondary | ICD-10-CM

## 2014-08-01 DIAGNOSIS — IMO0002 Reserved for concepts with insufficient information to code with codable children: Secondary | ICD-10-CM | POA: Insufficient documentation

## 2014-08-01 DIAGNOSIS — Z3A20 20 weeks gestation of pregnancy: Secondary | ICD-10-CM | POA: Diagnosis not present

## 2014-08-02 ENCOUNTER — Encounter: Payer: Self-pay | Admitting: Obstetrics & Gynecology

## 2014-08-25 ENCOUNTER — Encounter: Payer: Self-pay | Admitting: Family Medicine

## 2014-10-19 ENCOUNTER — Encounter: Payer: Self-pay | Admitting: Advanced Practice Midwife

## 2014-10-19 ENCOUNTER — Ambulatory Visit (INDEPENDENT_AMBULATORY_CARE_PROVIDER_SITE_OTHER): Payer: Medicaid Other | Admitting: Advanced Practice Midwife

## 2014-10-19 VITALS — BP 126/79 | HR 102 | Temp 98.4°F | Wt 268.5 lb

## 2014-10-19 DIAGNOSIS — O10913 Unspecified pre-existing hypertension complicating pregnancy, third trimester: Secondary | ICD-10-CM | POA: Diagnosis not present

## 2014-10-19 DIAGNOSIS — Z91199 Patient's noncompliance with other medical treatment and regimen due to unspecified reason: Secondary | ICD-10-CM

## 2014-10-19 DIAGNOSIS — O0933 Supervision of pregnancy with insufficient antenatal care, third trimester: Secondary | ICD-10-CM

## 2014-10-19 DIAGNOSIS — Z9119 Patient's noncompliance with other medical treatment and regimen: Secondary | ICD-10-CM | POA: Diagnosis not present

## 2014-10-19 DIAGNOSIS — O09893 Supervision of other high risk pregnancies, third trimester: Secondary | ICD-10-CM | POA: Diagnosis present

## 2014-10-19 DIAGNOSIS — O0993 Supervision of high risk pregnancy, unspecified, third trimester: Secondary | ICD-10-CM

## 2014-10-19 DIAGNOSIS — Z23 Encounter for immunization: Secondary | ICD-10-CM | POA: Diagnosis not present

## 2014-10-19 LAB — POCT URINALYSIS DIP (DEVICE)
BILIRUBIN URINE: NEGATIVE
Glucose, UA: NEGATIVE mg/dL
HGB URINE DIPSTICK: NEGATIVE
KETONES UR: NEGATIVE mg/dL
Nitrite: NEGATIVE
PH: 7 (ref 5.0–8.0)
PROTEIN: NEGATIVE mg/dL
Specific Gravity, Urine: 1.015 (ref 1.005–1.030)
Urobilinogen, UA: 0.2 mg/dL (ref 0.0–1.0)

## 2014-10-19 LAB — CBC
HCT: 32.8 % — ABNORMAL LOW (ref 36.0–46.0)
HEMOGLOBIN: 10.9 g/dL — AB (ref 12.0–15.0)
MCH: 26.8 pg (ref 26.0–34.0)
MCHC: 33.2 g/dL (ref 30.0–36.0)
MCV: 80.6 fL (ref 78.0–100.0)
MPV: 9.5 fL (ref 8.6–12.4)
PLATELETS: 243 10*3/uL (ref 150–400)
RBC: 4.07 MIL/uL (ref 3.87–5.11)
RDW: 16.2 % — ABNORMAL HIGH (ref 11.5–15.5)
WBC: 4.5 10*3/uL (ref 4.0–10.5)

## 2014-10-19 LAB — GLUCOSE TOLERANCE, 1 HOUR (50G) W/O FASTING: Glucose, 1 Hour GTT: 128 mg/dL (ref 70–140)

## 2014-10-19 MED ORDER — TETANUS-DIPHTH-ACELL PERTUSSIS 5-2.5-18.5 LF-MCG/0.5 IM SUSP
0.5000 mL | Freq: Once | INTRAMUSCULAR | Status: AC
  Administered 2014-10-19: 0.5 mL via INTRAMUSCULAR

## 2014-10-19 MED ORDER — CONCEPT DHA 53.5-38-1 MG PO CAPS: 1.0000 | ORAL_CAPSULE | Freq: Every day | ORAL | Status: DC

## 2014-10-19 NOTE — Progress Notes (Signed)
Breastfeeding tip of the week reviewed 28 week education packet given 1ht gtt, cbc rpr, hiv today TDap today, flu declined

## 2014-10-19 NOTE — Progress Notes (Signed)
Subjective:  Alicia Simon is a 19 y.o. G2P1001 at [redacted]w[redacted]d being seen today for ongoing prenatal care.  Patient reports nausea and vomiting.  Contractions: Not present.  Vag. Bleeding: None. Movement: Present. Denies leaking of fluid.   The following portions of the patient's history were reviewed and updated as appropriate: allergies, current medications, past family history, past medical history, past social history, past surgical history and problem list.   Objective:   Filed Vitals:   10/19/14 0934  BP: 126/79  Pulse: 102  Temp: 98.4 F (36.9 C)  Weight: 268 lb 8 oz (121.791 kg)    Fetal Status: Fetal Heart Rate (bpm): 147   Movement: Present     General:  Alert, oriented and cooperative. Patient is in no acute distress.  Skin: Skin is warm and dry. No rash noted.   Cardiovascular: Normal heart rate noted  Respiratory: Normal respiratory effort, no problems with respiration noted  Abdomen: Soft, gravid, appropriate for gestational age. Pain/Pressure: Present     Pelvic: Vag. Bleeding: None     Cervical exam deferred        Extremities: Normal range of motion.  Edema: Trace  Mental Status: Normal mood and affect. Normal behavior. Normal judgment and thought content.   Urinalysis:      Assessment and Plan:  Pregnancy: G2P1001 at [redacted]w[redacted]d  1. Supervision of high-risk pregnancy, third trimester  - Glucose Tolerance, 1 HR (50g) w/o Fasting - RPR - CBC - HIV antibody (with reflex) - [place] MFM OB FOLLOW UP; Future - Prenat-FeFum-FePo-FA-Omega 3 (CONCEPT DHA) 53.5-38-1 MG CAPS; Take 1 tablet by mouth daily.  Dispense: 30 capsule; Refill: 12  2. Noncompliant pregnant patient in third trimester, antepartum  - [place] MFM FETAL BPP W/NONSTRESS; Future  3. Preexisting hypertension complicating pregnancy, antepartum, third trimester  - [place] [facility] OB FOLLOW UP; Future - [place] MFM FETAL BPP W/NONSTRESS; Future  4. Need for Tdap vaccination  - Tdap (BOOSTRIX) injection 0.5 mL; Inject 0.5 mLs  into the muscle once.  5. Limited prenatal care in third trimester   Preterm labor symptoms and general obstetric precautions including but not limited to vaginal bleeding, contractions, leaking of fluid and fetal movement were reviewed in detail with the patient. Please refer to After Visit Summary for other counseling recommendations.  Return for Start twice weekly testing week 9/19.  Diane to schedule.[name], CNM

## 2014-10-19 NOTE — Patient Instructions (Addendum)
Preterm Labor Information Preterm labor is when labor starts at less than 37 weeks of pregnancy. The normal length of a pregnancy is 39 to 41 weeks. CAUSES Often, there is no identifiable underlying cause as to why a woman goes into preterm labor. One of the most common known causes of preterm labor is infection. Infections of the uterus, cervix, vagina, amniotic sac, bladder, kidney, or even the lungs (pneumonia) can cause labor to start. Other suspected causes of preterm labor include:   Urogenital infections, such as yeast infections and bacterial vaginosis.   Uterine abnormalities (uterine shape, uterine septum, fibroids, or bleeding from the placenta).   A cervix that has been operated on (it may fail to stay closed).   Malformations in the fetus.   Multiple gestations (twins, triplets, and so on).   Breakage of the amniotic sac.  RISK FACTORS  Having a previous history of preterm labor.   Having premature rupture of membranes (PROM).   Having a placenta that covers the opening of the cervix (placenta previa).   Having a placenta that separates from the uterus (placental abruption).   Having a cervix that is too weak to hold the fetus in the uterus (incompetent cervix).   Having too much fluid in the amniotic sac (polyhydramnios).   Taking illegal drugs or smoking while pregnant.   Not gaining enough weight while pregnant.   Being younger than 64 and older than 21 years old.   Having a low socioeconomic status.   Being African American. SYMPTOMS Signs and symptoms of preterm labor include:   Menstrual-like cramps, abdominal pain, or back pain.  Uterine contractions that are regular, as frequent as six in an hour, regardless of their intensity (may be mild or painful).  Contractions that start on the top of the uterus and spread down to the lower abdomen and back.   A sense of increased pelvic pressure.   A watery or bloody mucus discharge that  comes from the vagina.  TREATMENT Depending on the length of the pregnancy and other circumstances, your health care provider may suggest bed rest. If necessary, there are medicines that can be given to stop contractions and to mature the fetal lungs. If labor happens before 34 weeks of pregnancy, a prolonged hospital stay may be recommended. Treatment depends on the condition of both you and the fetus.  WHAT SHOULD YOU DO IF YOU THINK YOU ARE IN PRETERM LABOR? Call your health care provider right away. You will need to go to the hospital to get checked immediately. HOW CAN YOU PREVENT PRETERM LABOR IN FUTURE PREGNANCIES? You should:   Stop smoking if you smoke.  Maintain healthy weight gain and avoid chemicals and drugs that are not necessary.  Be watchful for any type of infection.  Inform your health care provider if you have a known history of preterm labor. Document Released: 04/20/2003 Document Revised: 09/30/2012 Document Reviewed: 03/02/2012 [facility] Patient Information 2015 [place], [place]. This information is not intended to replace advice given to you by your health care provider. Make sure you discuss any questions you have with your health care provider.   Contraceptive Implant Information A contraceptive implant is a plastic rod that is inserted under your skin. It is usually inserted under the skin of your upper arm. It continually releases small amounts of progestin (synthetic progesterone) into your bloodstream. This prevents an egg from being released from your ovaries. It also thickens your cervical mucus to prevent sperm from entering the cervix, and  it thins your uterine lining to prevent a fertilized egg from attaching to your uterus. Contraceptive implants can be effective for up to 3 years. They do not provide protection against sexually transmitted diseases (STDs).  The procedure to insert an implant usually takes about 10 minutes. There may be minor bruising,  swelling, and discomfort at the insertion site for a couple days. The implant begins to work within the first day. Other contraceptive protection may be necessary for 7 days. Be sure to discuss with your health care provider if you need a backup method of contraception.  Your health care provider will make sure you are a good candidate for the contraceptive implant. Discuss with your health care provider the possible side effects of the implant. ADVANTAGES  It prevents pregnancy for up to 3 years.  It is easily reversible.  It is convenient.  It can be used when breastfeeding.  It can be used by women who cannot take estrogen. DISADVANTAGES  You may have irregular or unplanned vaginal bleeding.  You may develop side effects, including headache, weight gain, acne, breast tenderness, or mood changes.  You may have tissue or nerve damage after insertion (rare).  It may be difficult and uncomfortable to remove.  Certain medicines may interfere with the effectiveness of the implant. REMOVAL OF IMPLANT The implant should be removed in 3 years or as directed by your health care provider. The implant's effect wears off in a few hours after removal. Your ability to get pregnant (fertility) may be restored in 1-2 weeks. A new implant can be inserted as soon as the old one is removed if desired. CONTRAINDICATIONS You should not get the implant if you are experiencing any of the following situations:  You are pregnant.  You have a history of breast cancer, osteoporosis, blood clots, heart disease, diabetes, high blood pressure, liver disease, tumors, or stroke.   You have undiagnosed vaginal bleeding.  You have a sensitivity to any part of the implant. Document Released: 01/17/2011 Document Revised: 09/30/2012 Document Reviewed: 07/27/2012 [facility] Patient Information 2015 [place], [place]. This information is not intended to replace advice given to you by your health care provider. Make  sure you discuss any questions you have with your health care provider.

## 2014-10-20 LAB — HIV ANTIBODY (ROUTINE TESTING W REFLEX): HIV: NONREACTIVE

## 2014-10-21 LAB — RPR

## 2014-10-27 ENCOUNTER — Ambulatory Visit (HOSPITAL_COMMUNITY)
Admission: RE | Admit: 2014-10-27 | Discharge: 2014-10-27 | Disposition: A | Discharge status: Home / Self Care | Payer: Medicaid Other | Source: Ambulatory Visit | Attending: Advanced Practice Midwife | Admitting: Advanced Practice Midwife

## 2014-10-27 ENCOUNTER — Encounter (HOSPITAL_COMMUNITY): Payer: Self-pay

## 2014-10-27 VITALS — BP 123/65 | HR 104 | Wt 275.5 lb

## 2014-10-27 DIAGNOSIS — O0933 Supervision of pregnancy with insufficient antenatal care, third trimester: Secondary | ICD-10-CM

## 2014-10-27 DIAGNOSIS — O09893 Supervision of other high risk pregnancies, third trimester: Secondary | ICD-10-CM | POA: Insufficient documentation

## 2014-10-27 DIAGNOSIS — O99212 Obesity complicating pregnancy, second trimester: Secondary | ICD-10-CM

## 2014-10-27 DIAGNOSIS — O10913 Unspecified pre-existing hypertension complicating pregnancy, third trimester: Secondary | ICD-10-CM | POA: Insufficient documentation

## 2014-10-27 DIAGNOSIS — Z91199 Patient's noncompliance with other medical treatment and regimen due to unspecified reason: Secondary | ICD-10-CM

## 2014-10-27 DIAGNOSIS — IMO0001 Reserved for inherently not codable concepts without codable children: Secondary | ICD-10-CM

## 2014-10-27 DIAGNOSIS — O358XX0 Maternal care for other (suspected) fetal abnormality and damage, not applicable or unspecified: Secondary | ICD-10-CM

## 2014-10-27 DIAGNOSIS — O0993 Supervision of high risk pregnancy, unspecified, third trimester: Secondary | ICD-10-CM

## 2014-10-27 DIAGNOSIS — Z9119 Patient's noncompliance with other medical treatment and regimen: Secondary | ICD-10-CM

## 2014-10-27 LAB — AFP, QUAD SCREEN
AFP: 59.4 ng/mL
CURR GEST AGE: 19.4 wks.days
Down Syndrome Scr Risk Est: 1:12300 {titer}
HCG, Total: 7.67 IU/mL
INH: 320.8 pg/mL
Interpretation-AFP: NEGATIVE
MOM FOR HCG: 0.47
MoM for AFP: 1.48
MoM for INH: 2.46
OPEN SPINA BIFIDA: NEGATIVE
Osb Risk: 1:5960 {titer}
Tri 18 Scr Risk Est: NEGATIVE
UE3 VALUE: 1.21 ng/mL
uE3 Mom: 0.82

## 2014-10-31 ENCOUNTER — Other Ambulatory Visit: Payer: Medicaid Other

## 2014-11-03 ENCOUNTER — Ambulatory Visit (INDEPENDENT_AMBULATORY_CARE_PROVIDER_SITE_OTHER): Payer: Medicaid Other | Admitting: Family Medicine

## 2014-11-03 VITALS — BP 128/64 | HR 98 | Wt 274.2 lb

## 2014-11-03 DIAGNOSIS — Z9119 Patient's noncompliance with other medical treatment and regimen: Secondary | ICD-10-CM | POA: Diagnosis not present

## 2014-11-03 DIAGNOSIS — O10913 Unspecified pre-existing hypertension complicating pregnancy, third trimester: Secondary | ICD-10-CM | POA: Diagnosis present

## 2014-11-03 DIAGNOSIS — IMO0001 Reserved for inherently not codable concepts without codable children: Secondary | ICD-10-CM

## 2014-11-03 DIAGNOSIS — O10912 Unspecified pre-existing hypertension complicating pregnancy, second trimester: Secondary | ICD-10-CM

## 2014-11-03 DIAGNOSIS — Z91199 Patient's noncompliance with other medical treatment and regimen due to unspecified reason: Secondary | ICD-10-CM

## 2014-11-03 DIAGNOSIS — O0933 Supervision of pregnancy with insufficient antenatal care, third trimester: Secondary | ICD-10-CM

## 2014-11-03 DIAGNOSIS — O358XX1 Maternal care for other (suspected) fetal abnormality and damage, fetus 1: Secondary | ICD-10-CM | POA: Diagnosis not present

## 2014-11-03 DIAGNOSIS — O09893 Supervision of other high risk pregnancies, third trimester: Secondary | ICD-10-CM | POA: Diagnosis not present

## 2014-11-03 LAB — POCT URINALYSIS DIP (DEVICE)
BILIRUBIN URINE: NEGATIVE
GLUCOSE, UA: NEGATIVE mg/dL
HGB URINE DIPSTICK: NEGATIVE
Ketones, ur: NEGATIVE mg/dL
NITRITE: NEGATIVE
PH: 7 (ref 5.0–8.0)
PROTEIN: NEGATIVE mg/dL
Specific Gravity, Urine: 1.02 (ref 1.005–1.030)
UROBILINOGEN UA: 1 mg/dL (ref 0.0–1.0)

## 2014-11-03 LAB — FETAL NONSTRESS TEST

## 2014-11-03 MED ORDER — ASPIRIN EC 81 MG PO TBEC: 81.0000 mg | DELAYED_RELEASE_TABLET | Freq: Every day | ORAL | Status: DC

## 2014-11-03 NOTE — Progress Notes (Signed)
Subjective:  Alicia Simon is a 19 y.o. G2P1001 at [redacted]w[redacted]d being seen today for ongoing prenatal care.  Patient reports many questions about blood pressure, possible liver abnormality in baby, cause of preeclampsia.  Contractions: Not present.  Vag. Bleeding: None. Movement: Present. Denies leaking of fluid.   The following portions of the patient's history were reviewed and updated as appropriate: allergies, current medications, past family history, past medical history, past social history, past surgical history and problem list.   Objective:   Filed Vitals:   11/03/14 1126  BP: 128/64  Pulse: 98  Weight: 274 lb 3.2 oz (124.376 kg)    Fetal Status: Fetal Heart Rate (bpm): RNST   Movement: Present     General:  Alert, oriented and cooperative. Patient is in no acute distress.  Skin: Skin is warm and dry. No rash noted.   Cardiovascular: Normal heart rate noted  Respiratory: Normal respiratory effort, no problems with respiration noted  Abdomen: Soft, gravid, appropriate for gestational age. Pain/Pressure: Present     Pelvic: Vag. Bleeding: None     Cervical exam deferred        Extremities: Normal range of motion.  Edema: Trace  Mental Status: Normal mood and affect. Normal behavior. Normal judgment and thought content.   Urinalysis: Urine Protein: Negative Urine Glucose: Negative  Assessment and Plan:  Pregnancy: G2P1001 at [redacted]w[redacted]d  1. Preexisting hypertension complicating pregnancy, antepartum, third trimester - BP stable today, not on medications - Fetal nonstress test  2. Fetal renal anomaly- Left pyelectasis  3. Noncompliant pregnant patient in third trimester, antepartum  4. Limited prenatal care in third trimester UTD  Preterm labor symptoms and general obstetric precautions including but not limited to vaginal bleeding, contractions, leaking of fluid and fetal movement were reviewed in detail with the patient. Please refer to After Visit Summary for other counseling  recommendations.  Return in about 2 weeks (around 11/17/2014) for Routine prenatal care.   Federico Flake, MD

## 2014-11-03 NOTE — Patient Instructions (Signed)
Third Trimester of Pregnancy The third trimester is from week 29 through week 42, months 7 through 9. The third trimester is a time when the fetus is growing rapidly. At the end of the ninth month, the fetus is about 20 inches in length and weighs 6-10 pounds.  BODY CHANGES Your body goes through many changes during pregnancy. The changes vary from woman to woman.   Your weight will continue to increase. You can expect to gain 25-35 pounds (11-16 kg) by the end of the pregnancy.  You may begin to get stretch marks on your hips, abdomen, and breasts.  You may urinate more often because the fetus is moving lower into your pelvis and pressing on your bladder.  You may develop or continue to have heartburn as a result of your pregnancy.  You may develop constipation because certain hormones are causing the muscles that push waste through your intestines to slow down.  You may develop hemorrhoids or swollen, bulging veins (varicose veins).  You may have pelvic pain because of the weight gain and pregnancy hormones relaxing your joints between the bones in your pelvis. Backaches may result from overexertion of the muscles supporting your posture.  You may have changes in your hair. These can include thickening of your hair, rapid growth, and changes in texture. Some women also have hair loss during or after pregnancy, or hair that feels dry or thin. Your hair will most likely return to normal after your baby is born.  Your breasts will continue to grow and be tender. A yellow discharge may leak from your breasts called colostrum.  Your belly button may stick out.  You may feel short of breath because of your expanding uterus.  You may notice the fetus "dropping," or moving lower in your abdomen.  You may have a bloody mucus discharge. This usually occurs a few days to a week before labor begins.  Your cervix becomes thin and soft (effaced) near your due date. WHAT TO EXPECT AT YOUR  PRENATAL EXAMS  You will have prenatal exams every 2 weeks until week 36. Then, you will have weekly prenatal exams. During a routine prenatal visit:  You will be weighed to make sure you and the fetus are growing normally.  Your blood pressure is taken.  Your abdomen will be measured to track your baby's growth.  The fetal heartbeat will be listened to.  Any test results from the previous visit will be discussed.  You may have a cervical check near your due date to see if you have effaced. At around 36 weeks, your caregiver will check your cervix. At the same time, your caregiver will also perform a test on the secretions of the vaginal tissue. This test is to determine if a type of bacteria, Group B streptococcus, is present. Your caregiver will explain this further. Your caregiver may ask you:  What your birth plan is.  How you are feeling.  If you are feeling the baby move.  If you have had any abnormal symptoms, such as leaking fluid, bleeding, severe headaches, or abdominal cramping.  If you have any questions. Other tests or screenings that may be performed during your third trimester include:  Blood tests that check for low iron levels (anemia).  Fetal testing to check the health, activity level, and growth of the fetus. Testing is done if you have certain medical conditions or if there are problems during the pregnancy. FALSE LABOR You may feel small, irregular contractions that   eventually go away. These are called Braxton Hicks contractions, or false labor. Contractions may last for hours, days, or even weeks before true labor sets in. If contractions come at regular intervals, intensify, or become painful, it is best to be seen by your caregiver.  SIGNS OF LABOR   Menstrual-like cramps.  Contractions that are 5 minutes apart or less.  Contractions that start on the top of the uterus and spread down to the lower abdomen and back.  A sense of increased pelvic  pressure or back pain.  A watery or bloody mucus discharge that comes from the vagina. If you have any of these signs before the 37th week of pregnancy, call your caregiver right away. You need to go to the hospital to get checked immediately. HOME CARE INSTRUCTIONS   Avoid all smoking, herbs, alcohol, and unprescribed drugs. These chemicals affect the formation and growth of the baby.  Follow your caregiver's instructions regarding medicine use. There are medicines that are either safe or unsafe to take during pregnancy.  Exercise only as directed by your caregiver. Experiencing uterine cramps is a good sign to stop exercising.  Continue to eat regular, healthy meals.  Wear a good support bra for breast tenderness.  Do not use hot tubs, steam rooms, or saunas.  Wear your seat belt at all times when driving.  Avoid raw meat, uncooked cheese, cat litter boxes, and soil used by cats. These carry germs that can cause birth defects in the baby.  Take your prenatal vitamins.  Try taking a stool softener (if your caregiver approves) if you develop constipation. Eat more high-fiber foods, such as fresh vegetables or fruit and whole grains. Drink plenty of fluids to keep your urine clear or pale yellow.  Take warm sitz baths to soothe any pain or discomfort caused by hemorrhoids. Use hemorrhoid cream if your caregiver approves.  If you develop varicose veins, wear support hose. Elevate your feet for 15 minutes, 3-4 times a day. Limit salt in your diet.  Avoid heavy lifting, wear low heal shoes, and practice good posture.  Rest a lot with your legs elevated if you have leg cramps or low back pain.  Visit your dentist if you have not gone during your pregnancy. Use a soft toothbrush to brush your teeth and be gentle when you floss.  A sexual relationship may be continued unless your caregiver directs you otherwise.  Do not travel far distances unless it is absolutely necessary and only  with the approval of your caregiver.  Take prenatal classes to understand, practice, and ask questions about the labor and delivery.  Make a trial run to the hospital.  Pack your hospital bag.  Prepare the baby's nursery.  Continue to go to all your prenatal visits as directed by your caregiver. SEEK MEDICAL CARE IF:  You are unsure if you are in labor or if your water has broken.  You have dizziness.  You have mild pelvic cramps, pelvic pressure, or nagging pain in your abdominal area.  You have persistent nausea, vomiting, or diarrhea.  You have a bad smelling vaginal discharge.  You have pain with urination. SEEK IMMEDIATE MEDICAL CARE IF:   You have a fever.  You are leaking fluid from your vagina.  You have spotting or bleeding from your vagina.  You have severe abdominal cramping or pain.  You have rapid weight loss or gain.  You have shortness of breath with chest pain.  You notice sudden or extreme swelling   of your face, hands, ankles, feet, or legs.  You have not felt your baby move in over an hour.  You have severe headaches that do not go away with medicine.  You have vision changes. Document Released: 01/22/2001 Document Revised: 02/02/2013 Document Reviewed: 03/31/2012 [employer] Patient Information 2015 [employer]. This information is not intended to replace advice given to you by your health care provider. Make sure you discuss any questions you have with your health care provider.  Breastfeeding Deciding to breastfeed is one of the best choices you can make for you and your baby. A change in hormones during pregnancy causes your breast tissue to grow and increases the number and size of your milk ducts. These hormones also allow proteins, sugars, and fats from your blood supply to make breast milk in your milk-producing glands. Hormones prevent breast milk from being released before your baby is born as well as prompt milk flow after birth. Once  breastfeeding has begun, thoughts of your baby, as well as his or her sucking or crying, can stimulate the release of milk from your milk-producing glands.  BENEFITS OF BREASTFEEDING For Your Baby  Your first milk (colostrum) helps your baby's digestive system function better.   There are antibodies in your milk that help your baby fight off infections.   Your baby has a lower incidence of asthma, allergies, and sudden infant death syndrome.   The nutrients in breast milk are better for your baby than infant formulas and are designed uniquely for your baby's needs.   Breast milk improves your baby's brain development.   Your baby is less likely to develop other conditions, such as childhood obesity, asthma, or type 2 diabetes mellitus.  For You   Breastfeeding helps to create a very special bond between you and your baby.   Breastfeeding is convenient. Breast milk is always available at the correct temperature and costs nothing.   Breastfeeding helps to burn calories and helps you lose the weight gained during pregnancy.   Breastfeeding makes your uterus contract to its prepregnancy size faster and slows bleeding (lochia) after you give birth.   Breastfeeding helps to lower your risk of developing type 2 diabetes mellitus, osteoporosis, and breast or ovarian cancer later in life. SIGNS THAT YOUR BABY IS HUNGRY Early Signs of Hunger  Increased alertness or activity.  Stretching.  Movement of the head from side to side.  Movement of the head and opening of the mouth when the corner of the mouth or cheek is stroked (rooting).  Increased sucking sounds, smacking lips, cooing, sighing, or squeaking.  Hand-to-mouth movements.  Increased sucking of fingers or hands. Late Signs of Hunger  Fussing.  Intermittent crying. Extreme Signs of Hunger Signs of extreme hunger will require calming and consoling before your baby will be able to breastfeed successfully. Do not  wait for the following signs of extreme hunger to occur before you initiate breastfeeding:   Restlessness.  A loud, strong cry.   Screaming. BREASTFEEDING BASICS Breastfeeding Initiation  Find a comfortable place to sit or lie down, with your neck and back well supported.  Place a pillow or rolled up blanket under your baby to bring him or her to the level of your breast (if you are seated). Nursing pillows are specially designed to help support your arms and your baby while you breastfeed.  Make sure that your baby's abdomen is facing your abdomen.   Gently massage your breast. With your fingertips, massage from your chest   wall toward your nipple in a circular motion. This encourages milk flow. You may need to continue this action during the feeding if your milk flows slowly.  Support your breast with 4 fingers underneath and your thumb above your nipple. Make sure your fingers are well away from your nipple and your baby's mouth.   Stroke your baby's lips gently with your finger or nipple.   When your baby's mouth is open wide enough, quickly bring your baby to your breast, placing your entire nipple and as much of the colored area around your nipple (areola) as possible into your baby's mouth.   More areola should be visible above your baby's upper lip than below the lower lip.   Your baby's tongue should be between his or her lower gum and your breast.   Ensure that your baby's mouth is correctly positioned around your nipple (latched). Your baby's lips should create a seal on your breast and be turned out (everted).  It is common for your baby to suck about 2-3 minutes in order to start the flow of breast milk. Latching Teaching your baby how to latch on to your breast properly is very important. An improper latch can cause nipple pain and decreased milk supply for you and poor weight gain in your baby. Also, if your baby is not latched onto your nipple properly, he or she  may swallow some air during feeding. This can make your baby fussy. Burping your baby when you switch breasts during the feeding can help to get rid of the air. However, teaching your baby to latch on properly is still the best way to prevent fussiness from swallowing air while breastfeeding. Signs that your baby has successfully latched on to your nipple:    Silent tugging or silent sucking, without causing you pain.   Swallowing heard between every 3-4 sucks.    Muscle movement above and in front of his or her ears while sucking.  Signs that your baby has not successfully latched on to nipple:   Sucking sounds or smacking sounds from your baby while breastfeeding.  Nipple pain. If you think your baby has not latched on correctly, slip your finger into the corner of your baby's mouth to break the suction and place it between your baby's gums. Attempt breastfeeding initiation again. Signs of Successful Breastfeeding Signs from your baby:   A gradual decrease in the number of sucks or complete cessation of sucking.   Falling asleep.   Relaxation of his or her body.   Retention of a small amount of milk in his or her mouth.   Letting go of your breast by himself or herself. Signs from you:  Breasts that have increased in firmness, weight, and size 1-3 hours after feeding.   Breasts that are softer immediately after breastfeeding.  Increased milk volume, as well as a change in milk consistency and color by the fifth day of breastfeeding.   Nipples that are not sore, cracked, or bleeding. Signs That Your Baby is Getting Enough Milk  Wetting at least 3 diapers in a 24-hour period. The urine should be clear and pale yellow by age 5 days.  At least 3 stools in a 24-hour period by age 5 days. The stool should be soft and yellow.  At least 3 stools in a 24-hour period by age 7 days. The stool should be seedy and yellow.  No loss of weight greater than 10% of birth weight  during the first 3   days of age.  Average weight gain of 4-7 ounces (113-198 g) per week after age 4 days.  Consistent daily weight gain by age 5 days, without weight loss after the age of 2 weeks. After a feeding, your baby may spit up a small amount. This is common. BREASTFEEDING FREQUENCY AND DURATION Frequent feeding will help you make more milk and can prevent sore nipples and breast engorgement. Breastfeed when you feel the need to reduce the fullness of your breasts or when your baby shows signs of hunger. This is called "breastfeeding on demand." Avoid introducing a pacifier to your baby while you are working to establish breastfeeding (the first 4-6 weeks after your baby is born). After this time you may choose to use a pacifier. Research has shown that pacifier use during the first year of a baby's life decreases the risk of sudden infant death syndrome (SIDS). Allow your baby to feed on each breast as long as he or she wants. Breastfeed until your baby is finished feeding. When your baby unlatches or falls asleep while feeding from the first breast, offer the second breast. Because newborns are often sleepy in the first few weeks of life, you may need to awaken your baby to get him or her to feed. Breastfeeding times will vary from baby to baby. However, the following rules can serve as a guide to help you ensure that your baby is properly fed:  Newborns (babies 4 weeks of age or younger) may breastfeed every 1-3 hours.  Newborns should not go longer than 3 hours during the day or 5 hours during the night without breastfeeding.  You should breastfeed your baby a minimum of 8 times in a 24-hour period until you begin to introduce solid foods to your baby at around 6 months of age. BREAST MILK PUMPING Pumping and storing breast milk allows you to ensure that your baby is exclusively fed your breast milk, even at times when you are unable to breastfeed. This is especially important if you are  going back to work while you are still breastfeeding or when you are not able to be present during feedings. Your lactation consultant can give you guidelines on how long it is safe to store breast milk.  A breast pump is a machine that allows you to pump milk from your breast into a sterile bottle. The pumped breast milk can then be stored in a refrigerator or freezer. Some breast pumps are operated by hand, while others use electricity. Ask your lactation consultant which type will work best for you. Breast pumps can be purchased, but some hospitals and breastfeeding support groups lease breast pumps on a monthly basis. A lactation consultant can teach you how to hand express breast milk, if you prefer not to use a pump.  CARING FOR YOUR BREASTS WHILE YOU BREASTFEED Nipples can become dry, cracked, and sore while breastfeeding. The following recommendations can help keep your breasts moisturized and healthy:  Avoid using soap on your nipples.   Wear a supportive bra. Although not required, special nursing bras and tank tops are designed to allow access to your breasts for breastfeeding without taking off your entire bra or top. Avoid wearing underwire-style bras or extremely tight bras.  Air dry your nipples for 3-4minutes after each feeding.   Use only cotton bra pads to absorb leaked breast milk. Leaking of breast milk between feedings is normal.   Use lanolin on your nipples after breastfeeding. Lanolin helps to maintain your skin's   normal moisture barrier. If you use pure lanolin, you do not need to wash it off before feeding your baby again. Pure lanolin is not toxic to your baby. You may also hand express a few drops of breast milk and gently massage that milk into your nipples and allow the milk to air dry. In the first few weeks after giving birth, some women experience extremely full breasts (engorgement). Engorgement can make your breasts feel heavy, warm, and tender to the touch.  Engorgement peaks within 3-5 days after you give birth. The following recommendations can help ease engorgement:  Completely empty your breasts while breastfeeding or pumping. You may want to start by applying warm, moist heat (in the shower or with warm water-soaked hand towels) just before feeding or pumping. This increases circulation and helps the milk flow. If your baby does not completely empty your breasts while breastfeeding, pump any extra milk after he or she is finished.  Wear a snug bra (nursing or regular) or tank top for 1-2 days to signal your body to slightly decrease milk production.  Apply ice packs to your breasts, unless this is too uncomfortable for you.  Make sure that your baby is latched on and positioned properly while breastfeeding. If engorgement persists after 48 hours of following these recommendations, contact your health care provider or a lactation consultant. OVERALL HEALTH CARE RECOMMENDATIONS WHILE BREASTFEEDING  Eat healthy foods. Alternate between meals and snacks, eating 3 of each per day. Because what you eat affects your breast milk, some of the foods may make your baby more irritable than usual. Avoid eating these foods if you are sure that they are negatively affecting your baby.  Drink milk, fruit juice, and water to satisfy your thirst (about 10 glasses a day).   Rest often, relax, and continue to take your prenatal vitamins to prevent fatigue, stress, and anemia.  Continue breast self-awareness checks.  Avoid chewing and smoking tobacco.  Avoid alcohol and drug use. Some medicines that may be harmful to your baby can pass through breast milk. It is important to ask your health care provider before taking any medicine, including all over-the-counter and prescription medicine as well as vitamin and herbal supplements. It is possible to become pregnant while breastfeeding. If birth control is desired, ask your health care provider about options that  will be safe for your baby. SEEK MEDICAL CARE IF:   You feel like you want to stop breastfeeding or have become frustrated with breastfeeding.  You have painful breasts or nipples.  Your nipples are cracked or bleeding.  Your breasts are red, tender, or warm.  You have a swollen area on either breast.  You have a fever or chills.  You have nausea or vomiting.  You have drainage other than breast milk from your nipples.  Your breasts do not become full before feedings by the fifth day after you give birth.  You feel sad and depressed.  Your baby is too sleepy to eat well.  Your baby is having trouble sleeping.   Your baby is wetting less than 3 diapers in a 24-hour period.  Your baby has less than 3 stools in a 24-hour period.  Your baby's skin or the white part of his or her eyes becomes yellow.   Your baby is not gaining weight by 5 days of age. SEEK IMMEDIATE MEDICAL CARE IF:   Your baby is overly tired (lethargic) and does not want to wake up and feed.  Your baby   develops an unexplained fever. Document Released: 01/28/2005 Document Revised: 02/02/2013 Document Reviewed: 07/22/2012 [employer] Patient Information 2015 [employer]. This information is not intended to replace advice given to you by your health care provider. Make sure you discuss any questions you have with your health care provider.  

## 2014-11-07 ENCOUNTER — Ambulatory Visit: Payer: Medicaid Other | Admitting: *Deleted

## 2014-11-07 DIAGNOSIS — O10913 Unspecified pre-existing hypertension complicating pregnancy, third trimester: Secondary | ICD-10-CM

## 2014-11-07 LAB — POCT URINALYSIS DIP (DEVICE)
GLUCOSE, UA: NEGATIVE mg/dL
Hgb urine dipstick: NEGATIVE
KETONES UR: 40 mg/dL — AB
NITRITE: NEGATIVE
PH: 7 (ref 5.0–8.0)
PROTEIN: NEGATIVE mg/dL
Specific Gravity, Urine: 1.02 (ref 1.005–1.030)
Urobilinogen, UA: 1 mg/dL (ref 0.0–1.0)

## 2014-11-07 NOTE — Progress Notes (Signed)
Called pt from lobby @ 1107 and she was not there.  I called her cell phone and she answered and stated that she had to leave to get ready for work. Pt reports good FM and is having no problems. She will keep appt on 9/29 as scheduled.

## 2014-11-09 ENCOUNTER — Encounter: Payer: Self-pay | Admitting: *Deleted

## 2014-11-10 ENCOUNTER — Other Ambulatory Visit: Payer: Medicaid Other

## 2014-11-14 ENCOUNTER — Telehealth: Payer: Self-pay | Admitting: *Deleted

## 2014-11-14 ENCOUNTER — Other Ambulatory Visit: Payer: Medicaid Other

## 2014-11-14 NOTE — Telephone Encounter (Signed)
Alicia Simon missed her appointment for obfu/ nst. Called her number and left a message she missed an appointment , please call and reschedule an appointment for Thursday. Will also send to registars to Call patient and reschedule for Thursday.

## 2014-11-17 ENCOUNTER — Other Ambulatory Visit: Payer: Medicaid Other

## 2014-11-22 ENCOUNTER — Encounter (HOSPITAL_COMMUNITY): Payer: Self-pay

## 2014-11-22 ENCOUNTER — Ambulatory Visit (INDEPENDENT_AMBULATORY_CARE_PROVIDER_SITE_OTHER): Payer: Medicaid Other | Admitting: Obstetrics and Gynecology

## 2014-11-22 ENCOUNTER — Ambulatory Visit (HOSPITAL_COMMUNITY)
Admission: RE | Admit: 2014-11-22 | Discharge: 2014-11-22 | Disposition: A | Discharge status: Home / Self Care | Payer: Medicaid Other | Source: Ambulatory Visit | Attending: Obstetrics and Gynecology | Admitting: Obstetrics and Gynecology

## 2014-11-22 ENCOUNTER — Other Ambulatory Visit (HOSPITAL_COMMUNITY)
Admission: RE | Admit: 2014-11-22 | Discharge: 2014-11-22 | Disposition: A | Discharge status: Home / Self Care | Payer: Medicaid Other | Source: Ambulatory Visit | Attending: Obstetrics and Gynecology | Admitting: Obstetrics and Gynecology

## 2014-11-22 VITALS — BP 137/71 | HR 92 | Wt 275.8 lb

## 2014-11-22 DIAGNOSIS — O0993 Supervision of high risk pregnancy, unspecified, third trimester: Secondary | ICD-10-CM

## 2014-11-22 DIAGNOSIS — IMO0001 Reserved for inherently not codable concepts without codable children: Secondary | ICD-10-CM

## 2014-11-22 DIAGNOSIS — O10913 Unspecified pre-existing hypertension complicating pregnancy, third trimester: Secondary | ICD-10-CM

## 2014-11-22 DIAGNOSIS — O0990 Supervision of high risk pregnancy, unspecified, unspecified trimester: Secondary | ICD-10-CM

## 2014-11-22 DIAGNOSIS — O358XX0 Maternal care for other (suspected) fetal abnormality and damage, not applicable or unspecified: Secondary | ICD-10-CM | POA: Insufficient documentation

## 2014-11-22 DIAGNOSIS — Z113 Encounter for screening for infections with a predominantly sexual mode of transmission: Secondary | ICD-10-CM | POA: Insufficient documentation

## 2014-11-22 LAB — POCT URINALYSIS DIP (DEVICE)
BILIRUBIN URINE: NEGATIVE
Glucose, UA: NEGATIVE mg/dL
Hgb urine dipstick: NEGATIVE
KETONES UR: NEGATIVE mg/dL
Nitrite: NEGATIVE
PH: 7 (ref 5.0–8.0)
PROTEIN: NEGATIVE mg/dL
SPECIFIC GRAVITY, URINE: 1.02 (ref 1.005–1.030)
Urobilinogen, UA: 1 mg/dL (ref 0.0–1.0)

## 2014-11-22 LAB — OB RESULTS CONSOLE GC/CHLAMYDIA: GC PROBE AMP, GENITAL: NEGATIVE

## 2014-11-22 LAB — OB RESULTS CONSOLE GBS: GBS: NEGATIVE

## 2014-11-22 NOTE — Progress Notes (Signed)
US for growth done today 

## 2014-11-22 NOTE — Progress Notes (Signed)
Subjective:  Alicia Simon is a 19 y.o. G2P1001 at [redacted]w[redacted]d being seen today for ongoing prenatal care.  Patient reports no complaints.  Contractions: Irregular.  Vag. Bleeding: None.  . Denies leaking of fluid.   The following portions of the patient's history were reviewed and updated as appropriate: allergies, current medications, past family history, past medical history, past social history, past surgical history and problem list. Problem list updated.  Objective:   Filed Vitals:   11/22/14 1122  BP: 137/71  Pulse: 92  Weight: 275 lb 12.8 oz (125.102 kg)    Fetal Status:           General:  Alert, oriented and cooperative. Patient is in no acute distress.  Skin: Skin is warm and dry. No rash noted.   Cardiovascular: Normal heart rate noted  Respiratory: Normal respiratory effort, no problems with respiration noted  Abdomen: Soft, gravid, appropriate for gestational age. Pain/Pressure: Present     Pelvic: Vag. Bleeding: None     Cervical exam deferred        Extremities: Normal range of motion.  Edema: Trace  Mental Status: Normal mood and affect. Normal behavior. Normal judgment and thought content.   Urinalysis: Urine Protein: Negative Urine Glucose: Negative  Assessment and Plan:  Pregnancy: G2P1001 at [redacted]w[redacted]d  1. Preexisting hypertension complicating pregnancy, antepartum, third trimester - GBS, gonorrhea, chlamydia today - Fetal nonstress test reactive - lengthy larc discussion - patient wants mirena - f/u results of u/s performed today  # pyelectasis - f/u u/s results today  Preterm labor symptoms and general obstetric precautions including but not limited to vaginal bleeding, contractions, leaking of fluid and fetal movement were reviewed in detail with the patient. Please refer to After Visit Summary for other counseling recommendations.  Return in about 3 days (around 11/25/2014) for 2x/wk as scheduled.   Kathrynn Running, MD

## 2014-11-23 LAB — GC/CHLAMYDIA PROBE AMP (~~LOC~~) NOT AT ARMC
Chlamydia: NEGATIVE
NEISSERIA GONORRHEA: NEGATIVE

## 2014-11-24 LAB — CULTURE, BETA STREP (GROUP B ONLY)

## 2014-11-25 ENCOUNTER — Other Ambulatory Visit: Payer: Medicaid Other

## 2014-11-28 ENCOUNTER — Ambulatory Visit (INDEPENDENT_AMBULATORY_CARE_PROVIDER_SITE_OTHER): Payer: Medicaid Other | Admitting: *Deleted

## 2014-11-28 VITALS — BP 120/64 | HR 94

## 2014-11-28 DIAGNOSIS — O10913 Unspecified pre-existing hypertension complicating pregnancy, third trimester: Secondary | ICD-10-CM | POA: Diagnosis present

## 2014-11-28 NOTE — Progress Notes (Signed)
NST reactive.

## 2014-11-28 NOTE — Progress Notes (Signed)
Pt reports increased pelvic pressure and UC's - requested Cx exam.  Exam performed by Dr. Jolayne Pantheronstant - 0.5cm dilated.  Labor sx reviewed.

## 2014-12-01 ENCOUNTER — Ambulatory Visit (INDEPENDENT_AMBULATORY_CARE_PROVIDER_SITE_OTHER): Payer: Medicaid Other | Admitting: Family Medicine

## 2014-12-01 VITALS — BP 120/62 | HR 102 | Wt 278.0 lb

## 2014-12-01 DIAGNOSIS — O0993 Supervision of high risk pregnancy, unspecified, third trimester: Secondary | ICD-10-CM

## 2014-12-01 DIAGNOSIS — O10913 Unspecified pre-existing hypertension complicating pregnancy, third trimester: Secondary | ICD-10-CM

## 2014-12-01 DIAGNOSIS — O0990 Supervision of high risk pregnancy, unspecified, unspecified trimester: Secondary | ICD-10-CM

## 2014-12-01 LAB — POCT URINALYSIS DIP (DEVICE)
Bilirubin Urine: NEGATIVE
Glucose, UA: NEGATIVE mg/dL
Hgb urine dipstick: NEGATIVE
KETONES UR: NEGATIVE mg/dL
Nitrite: NEGATIVE
PH: 7 (ref 5.0–8.0)
PROTEIN: NEGATIVE mg/dL
SPECIFIC GRAVITY, URINE: 1.02 (ref 1.005–1.030)
Urobilinogen, UA: 1 mg/dL (ref 0.0–1.0)

## 2014-12-01 NOTE — Progress Notes (Signed)
Subjective:  Alicia Simon is a 19 y.o. G2P1001 at 6684w6d being seen today for ongoing prenatal care.  Patient reports no complaints.  Contractions: Irregular.  Vag. Bleeding: None. Movement: Present. Denies leaking of fluid.   The following portions of the patient's history were reviewed and updated as appropriate: allergies, current medications, past family history, past medical history, past social history, past surgical history and problem list. Problem list updated.  Objective:   Filed Vitals:   12/01/14 1116  BP: 120/62  Pulse: 102  Weight: 278 lb (126.1 kg)    Fetal Status: Fetal Heart Rate (bpm): NST   Movement: Present     General:  Alert, oriented and cooperative. Patient is in no acute distress.  Skin: Skin is warm and dry. No rash noted.   Cardiovascular: Normal heart rate noted  Respiratory: Normal respiratory effort, no problems with respiration noted  Abdomen: Soft, gravid, appropriate for gestational age. Pain/Pressure: Present     Pelvic: Vag. Bleeding: None     Cervical exam deferred        Extremities: Normal range of motion.  Edema: Trace  Mental Status: Normal mood and affect. Normal behavior. Normal judgment and thought content.   Urinalysis: Urine Protein: Negative Urine Glucose: Negative  Assessment and Plan:  Pregnancy: G2P1001 at 5184w6d  1. Supervision of high-risk pregnancy, unspecified trimester FHT normal  2. Preexisting hypertension complicating pregnancy, antepartum, third trimester NST reactive - Fetal nonstress test  Term labor symptoms and general obstetric precautions including but not limited to vaginal bleeding, contractions, leaking of fluid and fetal movement were reviewed in detail with the patient. Please refer to After Visit Summary for other counseling recommendations.  Return in about 7 days (around 12/08/2014) for 1030 obfu/nst.   Levie HeritageJacob J Kylin Dubs, DO

## 2014-12-03 ENCOUNTER — Encounter (HOSPITAL_COMMUNITY): Payer: Self-pay

## 2014-12-03 ENCOUNTER — Inpatient Hospital Stay (HOSPITAL_COMMUNITY)
Admission: AD | Admit: 2014-12-03 | Discharge: 2014-12-05 | DRG: 775 | Disposition: A | Discharge status: Home / Self Care | Payer: Medicaid Other | Source: Ambulatory Visit | Attending: Family Medicine | Admitting: Family Medicine

## 2014-12-03 ENCOUNTER — Inpatient Hospital Stay (HOSPITAL_COMMUNITY): Payer: Medicaid Other | Admitting: Anesthesiology

## 2014-12-03 DIAGNOSIS — O0990 Supervision of high risk pregnancy, unspecified, unspecified trimester: Secondary | ICD-10-CM | POA: Diagnosis not present

## 2014-12-03 DIAGNOSIS — O358XX0 Maternal care for other (suspected) fetal abnormality and damage, not applicable or unspecified: Secondary | ICD-10-CM | POA: Diagnosis present

## 2014-12-03 DIAGNOSIS — O99824 Streptococcus B carrier state complicating childbirth: Secondary | ICD-10-CM | POA: Diagnosis not present

## 2014-12-03 DIAGNOSIS — O99214 Obesity complicating childbirth: Secondary | ICD-10-CM | POA: Diagnosis present

## 2014-12-03 DIAGNOSIS — Z91199 Patient's noncompliance with other medical treatment and regimen due to unspecified reason: Secondary | ICD-10-CM

## 2014-12-03 DIAGNOSIS — E669 Obesity, unspecified: Secondary | ICD-10-CM | POA: Diagnosis present

## 2014-12-03 DIAGNOSIS — O99210 Obesity complicating pregnancy, unspecified trimester: Secondary | ICD-10-CM | POA: Diagnosis present

## 2014-12-03 DIAGNOSIS — O99334 Smoking (tobacco) complicating childbirth: Secondary | ICD-10-CM | POA: Diagnosis present

## 2014-12-03 DIAGNOSIS — Z6841 Body Mass Index (BMI) 40.0 and over, adult: Secondary | ICD-10-CM | POA: Diagnosis not present

## 2014-12-03 DIAGNOSIS — O1092 Unspecified pre-existing hypertension complicating childbirth: Secondary | ICD-10-CM | POA: Diagnosis not present

## 2014-12-03 DIAGNOSIS — Z9119 Patient's noncompliance with other medical treatment and regimen: Secondary | ICD-10-CM

## 2014-12-03 DIAGNOSIS — O0933 Supervision of pregnancy with insufficient antenatal care, third trimester: Secondary | ICD-10-CM

## 2014-12-03 DIAGNOSIS — O164 Unspecified maternal hypertension, complicating childbirth: Secondary | ICD-10-CM | POA: Diagnosis present

## 2014-12-03 DIAGNOSIS — O10919 Unspecified pre-existing hypertension complicating pregnancy, unspecified trimester: Secondary | ICD-10-CM | POA: Diagnosis present

## 2014-12-03 DIAGNOSIS — Z3A38 38 weeks gestation of pregnancy: Secondary | ICD-10-CM | POA: Diagnosis not present

## 2014-12-03 DIAGNOSIS — IMO0002 Reserved for concepts with insufficient information to code with codable children: Secondary | ICD-10-CM | POA: Diagnosis present

## 2014-12-03 DIAGNOSIS — O4292 Full-term premature rupture of membranes, unspecified as to length of time between rupture and onset of labor: Secondary | ICD-10-CM | POA: Diagnosis present

## 2014-12-03 DIAGNOSIS — O09893 Supervision of other high risk pregnancies, third trimester: Secondary | ICD-10-CM

## 2014-12-03 LAB — CBC
HEMATOCRIT: 33.7 % — AB (ref 36.0–46.0)
HEMOGLOBIN: 11 g/dL — AB (ref 12.0–15.0)
MCH: 27.2 pg (ref 26.0–34.0)
MCHC: 32.6 g/dL (ref 30.0–36.0)
MCV: 83.2 fL (ref 78.0–100.0)
Platelets: 222 10*3/uL (ref 150–400)
RBC: 4.05 MIL/uL (ref 3.87–5.11)
RDW: 16.2 % — AB (ref 11.5–15.5)
WBC: 6.4 10*3/uL (ref 4.0–10.5)

## 2014-12-03 LAB — TYPE AND SCREEN
ABO/RH(D): AB POS
Antibody Screen: NEGATIVE

## 2014-12-03 MED ORDER — LACTATED RINGERS IV SOLN
INTRAVENOUS | Status: DC
  Administered 2014-12-03: 125 mL/h via INTRAVENOUS
  Administered 2014-12-03: 20:00:00 via INTRAVENOUS

## 2014-12-03 MED ORDER — PENICILLIN G POTASSIUM 5000000 UNITS IJ SOLR
5.0000 10*6.[IU] | Freq: Once | INTRAVENOUS | Status: DC
  Filled 2014-12-03: qty 5

## 2014-12-03 MED ORDER — EPHEDRINE 5 MG/ML INJ
10.0000 mg | INTRAVENOUS | Status: DC | PRN
  Filled 2014-12-03: qty 2

## 2014-12-03 MED ORDER — OXYCODONE-ACETAMINOPHEN 5-325 MG PO TABS: 1.0000 | ORAL_TABLET | ORAL | Status: DC | PRN

## 2014-12-03 MED ORDER — NALBUPHINE HCL 10 MG/ML IJ SOLN: 10.0000 mg | INTRAMUSCULAR | Status: DC | PRN

## 2014-12-03 MED ORDER — LIDOCAINE HCL (PF) 1 % IJ SOLN
INTRAMUSCULAR | Status: DC | PRN
  Administered 2014-12-03: 5 mL
  Administered 2014-12-03: 3 mL
  Administered 2014-12-03: 5 mL

## 2014-12-03 MED ORDER — ACETAMINOPHEN 325 MG PO TABS: 650.0000 mg | ORAL_TABLET | ORAL | Status: DC | PRN

## 2014-12-03 MED ORDER — FENTANYL CITRATE (PF) 100 MCG/2ML IJ SOLN
INTRAMUSCULAR | Status: AC
Start: 2014-12-03 — End: 2014-12-04
  Filled 2014-12-03: qty 2

## 2014-12-03 MED ORDER — OXYCODONE-ACETAMINOPHEN 5-325 MG PO TABS
1.0000 | ORAL_TABLET | Freq: Three times a day (TID) | ORAL | Status: DC | PRN
  Administered 2014-12-03 (×2): 1 via ORAL
  Filled 2014-12-03 (×2): qty 1

## 2014-12-03 MED ORDER — PENICILLIN G POTASSIUM 5000000 UNITS IJ SOLR
2.5000 10*6.[IU] | INTRAMUSCULAR | Status: DC
  Filled 2014-12-03: qty 2.5

## 2014-12-03 MED ORDER — TERBUTALINE SULFATE 1 MG/ML IJ SOLN
0.2500 mg | Freq: Once | INTRAMUSCULAR | Status: DC | PRN
  Filled 2014-12-03: qty 1

## 2014-12-03 MED ORDER — OXYCODONE-ACETAMINOPHEN 5-325 MG PO TABS: 2.0000 | ORAL_TABLET | ORAL | Status: DC | PRN

## 2014-12-03 MED ORDER — FLEET ENEMA 7-19 GM/118ML RE ENEM: 1.0000 | ENEMA | RECTAL | Status: DC | PRN

## 2014-12-03 MED ORDER — PROMETHAZINE HCL 25 MG/ML IJ SOLN: 25.0000 mg | Freq: Four times a day (QID) | INTRAMUSCULAR | Status: DC | PRN

## 2014-12-03 MED ORDER — LACTATED RINGERS IV SOLN
500.0000 mL | INTRAVENOUS | Status: DC | PRN
  Administered 2014-12-03: 500 mL via INTRAVENOUS

## 2014-12-03 MED ORDER — ONDANSETRON HCL 4 MG/2ML IJ SOLN: 4.0000 mg | Freq: Four times a day (QID) | INTRAMUSCULAR | Status: DC | PRN

## 2014-12-03 MED ORDER — OXYTOCIN BOLUS FROM INFUSION: 500.0000 mL | INTRAVENOUS | Status: DC

## 2014-12-03 MED ORDER — PHENYLEPHRINE 40 MCG/ML (10ML) SYRINGE FOR IV PUSH (FOR BLOOD PRESSURE SUPPORT)
80.0000 ug | PREFILLED_SYRINGE | INTRAVENOUS | Status: DC | PRN
  Filled 2014-12-03: qty 2
  Filled 2014-12-03: qty 20

## 2014-12-03 MED ORDER — FENTANYL 2.5 MCG/ML BUPIVACAINE 1/10 % EPIDURAL INFUSION (WH - ANES)
14.0000 mL/h | INTRAMUSCULAR | Status: DC | PRN
  Administered 2014-12-03 (×2): 14 mL/h via EPIDURAL
  Filled 2014-12-03: qty 125

## 2014-12-03 MED ORDER — MISOPROSTOL 200 MCG PO TABS
50.0000 ug | ORAL_TABLET | ORAL | Status: DC
  Administered 2014-12-03 (×2): 50 ug via ORAL
  Filled 2014-12-03 (×2): qty 0.5

## 2014-12-03 MED ORDER — OXYTOCIN 40 UNITS IN LACTATED RINGERS INFUSION - SIMPLE MED
62.5000 mL/h | INTRAVENOUS | Status: DC
  Filled 2014-12-03: qty 1000

## 2014-12-03 MED ORDER — DIPHENHYDRAMINE HCL 50 MG/ML IJ SOLN: 12.5000 mg | INTRAMUSCULAR | Status: DC | PRN

## 2014-12-03 MED ORDER — OXYTOCIN 40 UNITS IN LACTATED RINGERS INFUSION - SIMPLE MED: 1.0000 m[IU]/min | INTRAVENOUS | Status: DC

## 2014-12-03 MED ORDER — CITRIC ACID-SODIUM CITRATE 334-500 MG/5ML PO SOLN
30.0000 mL | ORAL | Status: DC | PRN
Start: 2014-12-03 — End: 2014-12-04

## 2014-12-03 MED ORDER — BENZOCAINE 10 % MT GEL
Freq: Four times a day (QID) | OROMUCOSAL | Status: DC | PRN
  Administered 2014-12-03: 4 via OROMUCOSAL
  Filled 2014-12-03: qty 9.4

## 2014-12-03 MED ORDER — OXYTOCIN 40 UNITS IN LACTATED RINGERS INFUSION - SIMPLE MED
1.0000 m[IU]/min | INTRAVENOUS | Status: DC
Start: 2014-12-03 — End: 2014-12-04
  Administered 2014-12-03: 2 m[IU]/min via INTRAVENOUS

## 2014-12-03 MED ORDER — NICOTINE 14 MG/24HR TD PT24
14.0000 mg | MEDICATED_PATCH | Freq: Every day | TRANSDERMAL | Status: DC
  Administered 2014-12-03: 14 mg via TRANSDERMAL
  Filled 2014-12-03 (×2): qty 1

## 2014-12-03 MED ORDER — LIDOCAINE HCL (PF) 1 % IJ SOLN
30.0000 mL | INTRAMUSCULAR | Status: DC | PRN
  Filled 2014-12-03: qty 30

## 2014-12-03 MED ORDER — FENTANYL CITRATE (PF) 100 MCG/2ML IJ SOLN
100.0000 ug | INTRAMUSCULAR | Status: DC | PRN
  Administered 2014-12-03 (×2): 100 ug via INTRAVENOUS
  Filled 2014-12-03: qty 2

## 2014-12-03 NOTE — H&P (Signed)
OBSTETRIC ADMISSION HISTORY AND PHYSICAL  Alicia Simon is a 19 y.o. female G2P1001 with IUP at 3791w1d by 20wk scan presenting for PROM @620  AM She reports +FMs, No LOF, no VB, no blurry vision, headaches or peripheral edema, and RUQ pain.  She plans on breast and formula feeding. She request IUD for birth control.  Pregnancy complicated by 1) cHTN 2) Limited PNC, lapse from 20-31 wks 3) Obesity 4) fLeft renal anomaly  Dating: By 20 --->  Estimated Date of Delivery: 12/16/14  Prenatal History/Complications:  [facility]  Dating 20 week scan Blood type:   AB pos  Genetic Screen Quad: wnl Antibody: neg  Anatomic US L sided fetal pyelectasis 7.5 mm, follow up showed Left cystic area  1.5cmx2cm in the left renal fossa with NO normal renal parenchyma and a dysplastic appearing kidney.  Rubella:  immune  GTT Early:   133            Third trimester: 128 RPR:  NR   Flu vaccine 10/19/14 HBsAg:   neg  TDaP vaccine 10/19/14                                             Rhogam:NA HIV:   NR  GBS         neg                                 (For PCN allergy, check sensitivities) GBS: negatve  Baby Food  Breast Pap: Normal, negative GC/Chlam on 07/26/14  Contraception  Mirena   Circumcision NA   Pediatrician ? [facility]--List given   Support Person FOB    Past Medical History: Past Medical History  Diagnosis Date  . Hypertension     Past Surgical History: Past Surgical History  Procedure Laterality Date  . Wisdom tooth extraction      Obstetrical History: OB History    Gravida Para Term Preterm AB TAB SAB Ectopic Multiple Living   2 1 1  0 0 0 0 0 0 1      Social History: Social History   Social History  . Marital Status: Single    Spouse Name: N/A  . Number of Children: N/A  . Years of Education: N/A   Social History Main Topics  . Smoking status: Current Every Day Smoker -- 0.25 packs/day for .5 years    Types: Cigarettes  . Smokeless tobacco: Never Used   . Alcohol Use: No  . Drug Use: No  . Sexual Activity: Not Currently    Birth Control/ Protection: None   Other Topics Concern  . None   Social History Narrative    Family History: History reviewed. No pertinent family history.  Allergies: No Known Allergies  Prescriptions prior to admission  Medication Sig Dispense Refill Last Dose  . aspirin EC 81 MG tablet Take 1 tablet (81 mg total) by mouth daily. Take after 12 weeks for prevention of preeclampsia later in pregnancy 60 tablet 2 Past Week at Unknown time  . Prenat-FeFum-FePo-FA-Omega 3 (CONCEPT DHA) 53.5-38-1 MG CAPS Take 1 tablet by mouth daily. 30 capsule 12 Past Week at Unknown time    Review of Systems  All systems reviewed and negative except as stated in HPI  Blood pressure 130/69, pulse 99, temperature 98.7  F (37.1 C), temperature source Oral, resp. rate 16, height  (1.6 m), weight 273 lb (123.832 kg), last menstrual period 02/14/2014. General appearance: alert, cooperative and appears stated age Lungs: clear to auscultation bilaterally Heart: regular rate and rhythm Abdomen: soft, non-tender; bowel sounds normal Pelvic: adequate Extremities: Homans sign is negative, no sign of DVT  Presentation: cephalic Fetal monitoringBaseline: 140 bpm, Variability: Good {> 6 bpm), Accelerations: Reactive and Decelerations: Absent Uterine activityFrequency: Every 8-10 minutes, not feeling ctx   Prenatal labs: ABO, Rh: AB/POS/-- (06/14 1556) Antibody: NEG (06/14 1556) Rubella:   RPR: NON REAC (09/07 1103)  HBsAg: NEGATIVE (06/14 1556)  HIV: NONREACTIVE (09/07 1103)  GBS: Negative (10/11 0000)  1 hr Glucola 128 Genetic screening  wnl Quad Anatomy US abnormal- see below  Prenatal Transfer Tool  Maternal Diabetes: No Genetic Screening: Normal- Quad wnl Maternal Ultrasounds/Referrals: Abnormal:  Findings:   Fetal Kidney Anomalies . Anatomy scan showed L sided fetal pyelectasis 7.5 mm, follow up US  showed  Left cystic area  1.5cmx2cm in the left renal fossa with NO normal renal parenchyma and a dysplastic appearing kidney.  Fetal Ultrasounds or other Referrals:  Referred to Materal Fetal Medicine - see [place] on 10/11 Maternal Substance Abuse:  No Significant Maternal Medications:  None Significant Maternal Lab Results: Lab values include: Group B Strep negative  No results found for this or any previous visit (from the past 24 hour(s)).  Patient Active Problem List   Diagnosis Date Noted  . Full-term premature rupture of membranes 12/03/2014  . Noncompliant pregnant patient in third trimester, antepartum 10/19/2014  . Limited prenatal care in third trimester 10/19/2014  . Fetal left pyelectasis   . Supervision of high-risk pregnancy 07/26/2014  . Obesity in pregnancy, antepartum 07/26/2014  . Preexisting hypertension complicating pregnancy, antepartum 05/06/2011    Assessment: Alicia Simon is a 19 y.o. G2P1001 at [redacted]w[redacted]d here for PROM without onset of contractions  #Labor: PROM , now 6 hours after ROM with minimal ctx but not feeling. Will start Cytotec PO. Consider FB if not feeling contractions after 4-5 hours.  #Pain: IV meds prn #FWB: Cat I #ID:  GBS negative, start PCN if ruptured > 18 hours (~0030 10/23)- order placed for PCN to start at this time. #MOF:  Breast and Bottle #MOC:IUD  #Renal Abnormality:  Discussed with RN and we will assure peds know about the lack of renal parenchyma and need for [place] post-delivery   #Scant PNC: Plan for pp SW   Federico Flake 12/03/2014, 12:55 PM

## 2014-12-03 NOTE — Progress Notes (Signed)
Patient had positive fern slide.

## 2014-12-03 NOTE — Anesthesia Procedure Notes (Signed)
Epidural Patient location during procedure: OB  Staffing Anesthesiologist: Koi Yarbro Performed by: anesthesiologist   Preanesthetic Checklist Completed: patient identified, site marked, surgical consent, pre-op evaluation, timeout performed, IV checked, risks and benefits discussed and monitors and equipment checked  Epidural Patient position: sitting Prep: DuraPrep Patient monitoring: heart rate, continuous pulse ox and blood pressure Approach: right paramedian Location: L3-L4 Injection technique: LOR saline  Needle:  Needle type: Tuohy  Needle gauge: 17 G Needle length: 9 cm and 9 Needle insertion depth: 7 cm Catheter type: closed end flexible Catheter size: 20 Guage Catheter at skin depth: 12 cm Test dose: negative  Assessment Events: blood not aspirated, injection not painful, no injection resistance, negative IV test and no paresthesia  Additional Notes Patient identified. Risks/Benefits/Options discussed with patient including but not limited to bleeding, infection, nerve damage, paralysis, failed block, incomplete pain control, headache, blood pressure changes, nausea, vomiting, reactions to medication both or allergic, itching and postpartum back pain. Confirmed with bedside nurse the patient's most recent platelet count. Confirmed with patient that they are not currently taking any anticoagulation, have any bleeding history or any family history of bleeding disorders. Patient expressed understanding and wished to proceed. All questions were answered. Sterile technique was used throughout the entire procedure. Please see nursing notes for vital signs. Test dose was given through epidural needle and negative prior to continuing to dose epidural or start infusion. Warning signs of high block given to the patient including shortness of breath, tingling/numbness in hands, complete motor block, or any concerning symptoms with instructions to call for help. Patient was given  instructions on fall risk and not to get out of bed. All questions and concerns addressed with instructions to call with any issues.   

## 2014-12-03 NOTE — MAU Note (Addendum)
Patient presents with PROM at 0620. Irregular contractions

## 2014-12-03 NOTE — Anesthesia Preprocedure Evaluation (Signed)
Anesthesia Evaluation  Patient identified by MRN, date of birth, ID band Patient awake    Reviewed: Allergy & Precautions, H&P , NPO status , Patient's Chart, lab work & pertinent test results  History of Anesthesia Complications Negative for: history of anesthetic complications  Airway Mallampati: II  TM Distance: >3 FB Neck ROM: full    Dental no notable dental hx. (+) Teeth Intact   Pulmonary Current Smoker,    Pulmonary exam normal breath sounds clear to auscultation       Cardiovascular hypertension, negative cardio ROS Normal cardiovascular exam Rhythm:regular Rate:Normal     Neuro/Psych negative neurological ROS  negative psych ROS   GI/Hepatic negative GI ROS, Neg liver ROS,   Endo/Other  negative endocrine ROSMorbid obesity  Renal/GU negative Renal ROS  negative genitourinary   Musculoskeletal   Abdominal   Peds  Hematology negative hematology ROS (+)   Anesthesia Other Findings   Reproductive/Obstetrics (+) Pregnancy                             Anesthesia Physical Anesthesia Plan  ASA: III  Anesthesia Plan: Epidural   Post-op Pain Management:    Induction:   Airway Management Planned:   Additional Equipment:   Intra-op Plan:   Post-operative Plan:   Informed Consent: I have reviewed the patients History and Physical, chart, labs and discussed the procedure including the risks, benefits and alternatives for the proposed anesthesia with the patient or authorized representative who has indicated his/her understanding and acceptance.     Plan Discussed with:   Anesthesia Plan Comments:         Anesthesia Quick Evaluation

## 2014-12-04 ENCOUNTER — Encounter (HOSPITAL_COMMUNITY): Payer: Self-pay | Admitting: *Deleted

## 2014-12-04 DIAGNOSIS — O1092 Unspecified pre-existing hypertension complicating childbirth: Secondary | ICD-10-CM

## 2014-12-04 DIAGNOSIS — O99824 Streptococcus B carrier state complicating childbirth: Secondary | ICD-10-CM

## 2014-12-04 DIAGNOSIS — O4292 Full-term premature rupture of membranes, unspecified as to length of time between rupture and onset of labor: Secondary | ICD-10-CM

## 2014-12-04 DIAGNOSIS — Z3A38 38 weeks gestation of pregnancy: Secondary | ICD-10-CM

## 2014-12-04 LAB — RPR: RPR Ser Ql: NONREACTIVE

## 2014-12-04 MED ORDER — SENNOSIDES-DOCUSATE SODIUM 8.6-50 MG PO TABS
2.0000 | ORAL_TABLET | ORAL | Status: DC
  Administered 2014-12-05: 2 via ORAL
  Filled 2014-12-04: qty 2

## 2014-12-04 MED ORDER — DIPHENHYDRAMINE HCL 25 MG PO CAPS: 25.0000 mg | ORAL_CAPSULE | Freq: Four times a day (QID) | ORAL | Status: DC | PRN

## 2014-12-04 MED ORDER — BENZOCAINE-MENTHOL 20-0.5 % EX AERO
1.0000 "application " | INHALATION_SPRAY | CUTANEOUS | Status: DC | PRN
  Administered 2014-12-04 – 2014-12-05 (×2): 1 via TOPICAL
  Filled 2014-12-04 (×2): qty 56

## 2014-12-04 MED ORDER — ACETAMINOPHEN 325 MG PO TABS
650.0000 mg | ORAL_TABLET | ORAL | Status: DC | PRN
  Administered 2014-12-04 (×2): 650 mg via ORAL
  Filled 2014-12-04 (×2): qty 2

## 2014-12-04 MED ORDER — OXYCODONE-ACETAMINOPHEN 5-325 MG PO TABS
1.0000 | ORAL_TABLET | ORAL | Status: DC | PRN
  Administered 2014-12-04 – 2014-12-05 (×3): 1 via ORAL
  Filled 2014-12-04 (×3): qty 1

## 2014-12-04 MED ORDER — IBUPROFEN 600 MG PO TABS
600.0000 mg | ORAL_TABLET | Freq: Four times a day (QID) | ORAL | Status: DC
  Administered 2014-12-04 – 2014-12-05 (×8): 600 mg via ORAL
  Filled 2014-12-04 (×8): qty 1

## 2014-12-04 MED ORDER — ZOLPIDEM TARTRATE 5 MG PO TABS: 5.0000 mg | ORAL_TABLET | Freq: Every evening | ORAL | Status: DC | PRN

## 2014-12-04 MED ORDER — ONDANSETRON HCL 4 MG/2ML IJ SOLN: 4.0000 mg | INTRAMUSCULAR | Status: DC | PRN

## 2014-12-04 MED ORDER — SIMETHICONE 80 MG PO CHEW: 80.0000 mg | CHEWABLE_TABLET | ORAL | Status: DC | PRN

## 2014-12-04 MED ORDER — OXYCODONE-ACETAMINOPHEN 5-325 MG PO TABS: 2.0000 | ORAL_TABLET | ORAL | Status: DC | PRN

## 2014-12-04 MED ORDER — PRENATAL MULTIVITAMIN CH
1.0000 | ORAL_TABLET | Freq: Every day | ORAL | Status: DC
  Administered 2014-12-04 – 2014-12-05 (×2): 1 via ORAL
  Filled 2014-12-04 (×2): qty 1

## 2014-12-04 MED ORDER — TETANUS-DIPHTH-ACELL PERTUSSIS 5-2.5-18.5 LF-MCG/0.5 IM SUSP
0.5000 mL | Freq: Once | INTRAMUSCULAR | Status: DC
  Filled 2014-12-04: qty 0.5

## 2014-12-04 MED ORDER — LANOLIN HYDROUS EX OINT: TOPICAL_OINTMENT | CUTANEOUS | Status: DC | PRN

## 2014-12-04 MED ORDER — ONDANSETRON HCL 4 MG PO TABS: 4.0000 mg | ORAL_TABLET | ORAL | Status: DC | PRN

## 2014-12-04 MED ORDER — DIBUCAINE 1 % RE OINT: 1.0000 "application " | TOPICAL_OINTMENT | RECTAL | Status: DC | PRN

## 2014-12-04 MED ORDER — WITCH HAZEL-GLYCERIN EX PADS: 1.0000 "application " | MEDICATED_PAD | CUTANEOUS | Status: DC | PRN

## 2014-12-04 MED ORDER — NICOTINE 14 MG/24HR TD PT24
14.0000 mg | MEDICATED_PATCH | Freq: Every day | TRANSDERMAL | Status: DC
  Administered 2014-12-04 – 2014-12-05 (×2): 14 mg via TRANSDERMAL
  Filled 2014-12-04 (×3): qty 1

## 2014-12-04 NOTE — Anesthesia Postprocedure Evaluation (Signed)
Anesthesia Post Note  Patient: Alicia Simon  Procedure(s) Performed: * No procedures listed *  Anesthesia type: Epidural  Patient location: Mother/Baby  Post pain: Pain level controlled  Post assessment: Post-op Vital signs reviewed  Last Vitals:  Filed Vitals:   12/04/14 1011  BP: 128/66  Pulse: 82  Temp: 36.6 C  Resp: 32    Post vital signs: Reviewed  Level of consciousness:alert  Complications: No apparent anesthesia complications

## 2014-12-04 NOTE — Lactation Note (Signed)
This note was copied from the chart of [name]. Lactation Consultation Note  Patient Name: [name] WUJWJ'XToday's Date: 12/04/2014 Reason for consult: Initial assessment Baby 13 hours old. Mom reports that she tried to nurse first child in hospital, had a cracked nipple and "gave up." Mom states she attempted to nurse this baby, but has decided she wants to give formula only.   Maternal Data    Feeding Feeding Type: Formula Nipple Type: Slow - flow  LATCH Score/Interventions                      Lactation Tools Discussed/Used     Consult Status Consult Status: PRN    Geralynn OchsWILLIARD, Beyonca Wisz 12/04/2014, 1:48 PM

## 2014-12-04 NOTE — Progress Notes (Signed)
Spoke to Dr. Sampson GoonFitzgerald on unit regarding patient's BPs. No new orders at this time, continue to watch and monitor BPs. Will let [facility] RN know at transfer.

## 2014-12-05 ENCOUNTER — Other Ambulatory Visit: Payer: Medicaid Other

## 2014-12-05 MED ORDER — IBUPROFEN 600 MG PO TABS: 600.0000 mg | ORAL_TABLET | Freq: Four times a day (QID) | ORAL | Status: DC

## 2014-12-05 NOTE — Progress Notes (Signed)
CSW notified by RN that infant's urine and meconium collected due to lapse in prenatal care.  CSW reviewed records. MOB initiated care at 20 weeks and did not return until [redacted]w[redacted]d.  MOB then received regular prenatal care until delivery.  MOB does not meet criteria for CSW assessment since MOB initiated care prior to 28 weeks and received more than 3 prenatal appointments. No additional psychosocial stressors noted in her chart.  Contact CSW if needs arise or upon MOB request. 

## 2014-12-05 NOTE — Discharge Summary (Signed)
OB Discharge Summary  Patient Name: Alicia Simon DOB: [birth year] MRN: [id number]  Date of admission: 12/03/2014 Delivering MD: Reva BoresPRATT, TANYA S   Date of discharge: 12/05/2014  Admitting diagnosis: 39 WKS, WATER BROKE Intrauterine pregnancy: 6959w2d     Secondary diagnosis:Active Problems:   Preexisting hypertension complicating pregnancy, antepartum   Supervision of high-risk pregnancy   Obesity in pregnancy, antepartum   Fetal left pyelectasis   Noncompliant pregnant patient in third trimester, antepartum   Limited prenatal care in third trimester   Full-term premature rupture of membranes  Additional problems:GHTN     Discharge diagnosis: Term Pregnancy Delivered                                                                     Post partum procedures:n/a  Augmentation: Pitocin  Complications: None  Hospital course:  Induction of Labor With Vaginal Delivery   19 y.o. yo [id number] at 5859w2d was admitted to the hospital 12/03/2014 for induction of labor.  Indication for induction: Gestational hypertension.  Patient had an uncomplicated labor course as follows: Membrane Rupture Time/Date: 6:20 AM ,12/03/2014   Intrapartum Procedures: Episiotomy: None [1]                                         Lacerations:  None [1]  Patient had delivery of a Viable infant.  Information for the patient's newborn:  Albina BilletDavis, Girl Kyarra [[id number]][[id number]]  Delivery Method: Vaginal, Spontaneous Delivery (Filed from Delivery Summary)   12/04/2014  Details of delivery can be found in separate delivery note.  Patient had a routine postpartum course. Patient is discharged home No discharge date for patient encounter.    Physical exam  Filed Vitals:   12/04/14 0425 12/04/14 1011 12/04/14 1823 12/05/14 0600  BP: 126/69 128/66 125/80 119/75  Pulse: 91 82 91 75  Temp: 98.8 F (37.1 C) 97.9 F (36.6 C) 98.2 F (36.8 C) 98 F (36.7 C)  TempSrc:  Oral Oral   Resp: 20 20 18 20   Height:       Weight:      SpO2:  99%     General: alert, cooperative and no distress Lochia: appropriate Uterine Fundus: firm Incision: N/A DVT Evaluation: Negative Homan's sign. No cords or calf tenderness. Labs: Lab Results  Component Value Date   WBC 6.4 12/03/2014   HGB 11.0* 12/03/2014   HCT 33.7* 12/03/2014   MCV 83.2 12/03/2014   PLT 222 12/03/2014   CMP Latest Ref Rng 07/26/2014  Glucose 70 - 99 mg/dL 696(E130(H)  BUN 6 - 23 mg/dL 2(L)  Creatinine 9.520.50 - 1.10 mg/dL 8.410.51  Sodium 324135 - 401145 mEq/L 132(L)  Potassium 3.5 - 5.3 mEq/L 3.7  Chloride 96 - 112 mEq/L 102  CO2 19 - 32 mEq/L 21  Calcium 8.4 - 10.5 mg/dL 8.5  Total Protein 6.0 - 8.3 g/dL 6.2  Total Bilirubin 0.2 - 1.2 mg/dL 0.2  Alkaline Phos 39 - 117 U/L 56  AST 0 - 37 U/L 18  ALT 0 - 35 U/L 20    Discharge instruction: per After Visit Summary and "Baby and Me Booklet".  Medications:  Current facility-administered medications:  .  acetaminophen (TYLENOL) tablet 650 mg, 650 mg, Oral, Q4H PRN, Casey Burkitt, MD, 650 mg at 12/04/14 1620 .  benzocaine-Menthol (DERMOPLAST) 20-0.5 % topical spray 1 application, 1 application, Topical, PRN, Casey Burkitt, MD, 1 application at 12/04/14 [phone] .  witch hazel-glycerin (TUCKS) pad 1 application, 1 application, Topical, PRN **AND** dibucaine (NUPERCAINAL) 1 % rectal ointment 1 application, 1 application, Rectal, PRN, Casey Burkitt, MD .  diphenhydrAMINE (BENADRYL) capsule 25 mg, 25 mg, Oral, Q6H PRN, Casey Burkitt, MD .  ibuprofen (ADVIL,MOTRIN) tablet 600 mg, 600 mg, Oral, 4 times per day, Casey Burkitt, MD, 600 mg at 12/05/14 0500 .  lanolin ointment, , Topical, PRN, Casey Burkitt, MD .  nicotine (NICODERM CQ - dosed in mg/24 hours) patch 14 mg, 14 mg, Transdermal, Daily, Montez Morita, CNM, 14 mg at 12/04/14 1539 .  ondansetron (ZOFRAN) tablet 4 mg, 4 mg, Oral, Q4H PRN **OR** ondansetron (ZOFRAN) injection 4 mg, 4 mg,  Intravenous, Q4H PRN, Casey Burkitt, MD .  oxyCODONE-acetaminophen (PERCOCET/ROXICET) 5-325 MG per tablet 1 tablet, 1 tablet, Oral, Q4H PRN, Casey Burkitt, MD, 1 tablet at 12/04/14 2249 .  oxyCODONE-acetaminophen (PERCOCET/ROXICET) 5-325 MG per tablet 2 tablet, 2 tablet, Oral, Q4H PRN, Casey Burkitt, MD .  prenatal multivitamin tablet 1 tablet, 1 tablet, Oral, Q1200, Casey Burkitt, MD, 1 tablet at 12/04/14 1148 .  senna-docusate (Senokot-S) tablet 2 tablet, 2 tablet, Oral, Q24H, Casey Burkitt, MD, 2 tablet at 12/05/14 0017 .  simethicone (MYLICON) chewable tablet 80 mg, 80 mg, Oral, PRN, Casey Burkitt, MD .  Tdap (BOOSTRIX) injection 0.5 mL, 0.5 mL, Intramuscular, Once, Casey Burkitt, MD .  zolpidem (AMBIEN) tablet 5 mg, 5 mg, Oral, QHS PRN, Casey Burkitt, MD  Facility-Administered Medications Ordered in Other Encounters:  .  lidocaine (PF) (XYLOCAINE) 1 % injection, , , Anesthesia Intra-op, Phillips Grout, MD, 3 mL at 12/03/14 2332 After Visit Meds:    Medication List    ASK your doctor about these medications        aspirin EC 81 MG tablet  Take 1 tablet (81 mg total) by mouth daily. Take after 12 weeks for prevention of preeclampsia later in pregnancy     CONCEPT DHA 53.5-38-1 MG Caps  Take 1 tablet by mouth daily.        Diet: routine diet  Activity: Advance as tolerated. Pelvic rest for 6 weeks.   Outpatient follow up:6 weeks Follow up Appt:No future appointments. Follow up visit: No Follow-up on file.  Postpartum contraception: IUD Mirena  Newborn Data: Live born female  Birth Weight: 6 lb 15.8 oz (3170 g) APGAR: 8,   Baby Feeding: Breast Disposition:home with mother   12/05/2014 Ferdie Ping, CNM

## 2014-12-05 NOTE — Lactation Note (Signed)
This note was copied from the chart of Girl Carmon Ginsbergashmere Pellegrini. Lactation Consultation Note  Patient Name: Girl Carmon GinsbergCashmere Strebel ZOXWR'UToday's Date: 12/05/2014 Reason for consult: Initial assessment [facility] Dr. Ronalee RedHartsell reporting Mom has decided to try BF. Mom reports with her 1st child she only BF for few days/week due to cracked nipple. Basic teaching reviewed with Mom. Mom using pacifier, LC reviewed risk of pacifier use this early with BF. Advised Mom baby should be at the breast 8-12 time in 24 hours and with feeding ques. Cluster feeding discussed. Baby awake but Mom did not want to latch baby at this visit. Baby recently had bottle/formula as well. LC encouraged Mom to call with next feeding for assist before giving any bottles. Lactation brochure left for review, advised of OP services and support group. Mom to call.   Maternal Data Has patient been taught Hand Expression?: Yes Does the patient have breastfeeding experience prior to this delivery?: Yes  Feeding Feeding Type: Formula Nipple Type: Slow - flow  LATCH Score/Interventions                      Lactation Tools Discussed/Used     Consult Status Consult Status: Follow-up Date: 12/06/14 Follow-up type: In-patient    Alfred LevinsGranger, Alekhya Gravlin Ann 12/05/2014, 5:01 PM

## 2014-12-06 ENCOUNTER — Ambulatory Visit: Payer: Self-pay

## 2014-12-06 NOTE — Lactation Note (Signed)
This note was copied from the chart of Alicia Carmon Ginsbergashmere Ketron. Lactation Consultation Note  Patient Name: Alicia Simon ZOXWR'UToday's Date: 12/06/2014 Reason for consult: Follow-up assessment (breast / bottle / baby not in room - having a Cytogram this am ) LC reviewed doc flow sheets at 57 hours of life - 6-10.9 oz ( 4% weight loss) voids and stools adequate for age ,  Bili check at 47 hours - 5.7, since LC saw mom yesterday early evening - fed 8 mins at the breast  And attempts since  But mostly bottles. Per mom the most time baby has latched for is 8 mins.  LC explained to mom the baby has got'en use to the quick flow from the bottle.  LC gave mom several options  Option #1 - Give the baby an appetizer of 5 -10 ml form a bottle ( due to being use to the bottle), and satisfy being really hungry  Then latch the baby . ( baby probably will be calmer at the breast and more patient with latch )  Option #2 - If baby isn't overly hungry - try latching after breast massage , hand express- pre- pump to prime the milk ducts,  And with firm support, and probably will have to supplement after wards.  Option #3 - if desire is to give breast milk and not latch - pumping every 2-3 hours , at least 8 times a day for 15 -20 mins both breast will help establish and protect  Milk supply.  Per mom had a DEBP at home - and her mom is looking for the DEBP at home .  Discussed with  Mom if she finds it , it will be great , [facility] not [facility] or if they don't have pumps available to consider [facility]  [facility] RN instructed mom on a hand pump this am.  Mom denies soreness, sore nipple and engorgement prevention and tx.  Referring to the Baby and me booklet pages 24 .  Mother informed of post-discharge support and given phone number to the lactation department, including services for phone call assistance; out-patient appointments; and breastfeeding support group. List of other breastfeeding  resources in the community given in the handout. Encouraged mother to call for problems or concerns related to breastfeeding.  Maternal Data    Feeding Feeding Type: Breast Fed  LATCH Score/Interventions                Intervention(s): Breastfeeding basics reviewed     Lactation Tools Discussed/Used Tools: Pump Breast pump type: Manual WIC Program: Yes (per mom [place] ) Pump Review: Setup, frequency, and cleaning Initiated by:: [facility] Date initiated:: 12/06/14   Consult Status Consult Status: Complete (mom may call for breast feeding assist ) Date: 12/06/14    Alicia Simon, [name] 12/06/2014, 10:22 AM

## 2014-12-08 ENCOUNTER — Other Ambulatory Visit: Payer: Medicaid Other

## 2014-12-27 ENCOUNTER — Encounter: Payer: Self-pay | Admitting: Certified Nurse Midwife

## 2014-12-27 ENCOUNTER — Ambulatory Visit (INDEPENDENT_AMBULATORY_CARE_PROVIDER_SITE_OTHER): Payer: Medicaid Other | Admitting: Certified Nurse Midwife

## 2014-12-27 VITALS — BP 137/95 | HR 89 | Temp 98.8°F | Ht 63.0 in | Wt 254.0 lb

## 2014-12-27 DIAGNOSIS — Z3202 Encounter for pregnancy test, result negative: Secondary | ICD-10-CM | POA: Diagnosis not present

## 2014-12-27 DIAGNOSIS — Z30014 Encounter for initial prescription of intrauterine contraceptive device: Secondary | ICD-10-CM

## 2014-12-27 DIAGNOSIS — Z30430 Encounter for insertion of intrauterine contraceptive device: Secondary | ICD-10-CM

## 2014-12-27 LAB — POCT PREGNANCY, URINE: Preg Test, Ur: NEGATIVE

## 2014-12-27 MED ORDER — LEVONORGESTREL 18.6 MCG/DAY IU IUD
INTRAUTERINE_SYSTEM | Freq: Once | INTRAUTERINE | Status: AC
  Administered 2014-12-27: 1 via INTRAUTERINE

## 2014-12-27 MED ORDER — IBUPROFEN 600 MG PO TABS
600.0000 mg | ORAL_TABLET | Freq: Once | ORAL | Status: AC
  Administered 2014-12-27: 600 mg via ORAL

## 2014-12-27 NOTE — Progress Notes (Deleted)
Patient ID: Alicia Simon, female   DOB: 18-Jun-1995, 19 y.o.   MRN: [id number] Subjective:     Alicia Simon is a 19 y.o. female who presents for a postpartum visit. She is {1-10:13787} {time; units:18646} postpartum following a {delivery:12449}. I have fully reviewed the prenatal and intrapartum course. The delivery was at *** gestational weeks. Outcome: {delivery outcome:32078}. Anesthesia: {anesthesia types:812}. Postpartum course has been ***. Baby's course has been ***. Baby is feeding by {breast/bottle:69}. Bleeding {vag bleed:12292}. Bowel function is {normal:32111}. Bladder function is {normal:32111}. Patient {is/is not:9024} sexually active. Contraception method is {contraceptive method:5051}. Postpartum depression screening: {neg default:13464::"negative"}.  {Common ambulatory SmartLinks:19316}  Review of Systems {ros; complete:30496}   Objective:    There were no vitals taken for this visit.  General:  {gen appearance:16600}   Breasts:  {breast exam:1202::"inspection negative, no nipple discharge or bleeding, no masses or nodularity palpable"}  Lungs: {lung exam:16931}  Heart:  {heart exam:5510}  Abdomen: {abdomen exam:16834}   Vulva:  {labia exam:12198}  Vagina: {vagina exam:12200}  Cervix:  {cervix exam:14595}  Corpus: {uterus exam:12215}  Adnexa:  {adnexa exam:12223}  Rectal Exam: {rectal/vaginal exam:12274}        Assessment:    *** postpartum exam. Pap smear {done:10129} at today's visit.   Plan:    1. Contraception: {method:5051} 2. *** 3. Follow up in: {1-10:13787} {time; units:19136} or as needed.

## 2014-12-27 NOTE — Progress Notes (Signed)
Patient ID: Alicia Simon, female   DOB: [birth year], 19 y.o.   MRN: [id number] Subjective:     Alicia Simon is a 19 y.o. female who presents for a postpartum visit. She is 3 weeks postpartum following a spontaneous vaginal delivery. I have fully reviewed the prenatal and intrapartum course. The delivery was at 38.2 gestational weeks. Outcome: spontaneous vaginal delivery. Anesthesia: epidural. Postpartum course has been unremarkable. Baby's course has been unremarkable. Baby is feeding by bottle - Similac Advance. Bleeding thin lochia. Bowel function is normal. Bladder function is normal. Patient is not sexually active. Contraception method is IUD. Postpartum depression screening: negative.  The following portions of the patient's history were reviewed and updated as appropriate: past social history, past surgical history and problem list.  Review of Systems Pertinent items are noted in HPI.   Objective:    BP 137/95 mmHg  Pulse 89  Temp(Src) 98.8 F (37.1 C) (Oral)  Ht 5\' 3"  (1.6 m)  Wt 115.214 kg (254 lb)  BMI 45.01 kg/m2  Breastfeeding? No  General:  alert and cooperative   Breasts:  inspection negative, no nipple discharge or bleeding, no masses or nodularity palpable  Lungs: clear to auscultation bilaterally  Heart:  regular rate and rhythm, S1, S2 normal, no murmur, click, rub or gallop  Abdomen: soft, non-tender; bowel sounds normal; no masses,  no organomegaly   Vulva:  normal  Vagina: normal vagina  Cervix:  anteverted  Corpus: not examined  Adnexa:    Rectal Exam:         Assessment:    3 postpartum exam. Pap smear not done at today's visit.   Plan:    1. Contraception: IUD 2. IUD Placement 3. Follow up in: 4 weeks or as needed.    Patient identified, informed consent performed, signed copy in chart, time out was performed.  Urine pregnancy test negative.  Speculum placed in the vagina.  Cervix visualized.  Cleaned with Betadine x 2.  Grasped anteriourly  with a single tooth tenaculum.  Uterus sounded to 6cm .  Liletta IUD placed per manufacturer's recommendations.  Strings trimmed to 3 cm.   Patient given post procedure instructions and Mirena care card with expiration date.  Patient is asked to check IUD strings periodically and follow up in 4-6 weeks for IUD check.

## 2015-01-02 ENCOUNTER — Ambulatory Visit: Payer: Medicaid Other | Admitting: Obstetrics & Gynecology

## 2015-01-27 ENCOUNTER — Ambulatory Visit: Payer: Medicaid Other | Admitting: Obstetrics & Gynecology

## 2016-02-27 ENCOUNTER — Ambulatory Visit (INDEPENDENT_AMBULATORY_CARE_PROVIDER_SITE_OTHER): Payer: Medicaid Other | Admitting: Advanced Practice Midwife

## 2016-02-27 ENCOUNTER — Encounter: Payer: Self-pay | Admitting: Advanced Practice Midwife

## 2016-02-27 ENCOUNTER — Other Ambulatory Visit (HOSPITAL_COMMUNITY)
Admission: RE | Admit: 2016-02-27 | Discharge: 2016-02-27 | Disposition: A | Discharge status: Home / Self Care | Payer: Medicaid Other | Source: Ambulatory Visit | Attending: Advanced Practice Midwife | Admitting: Advanced Practice Midwife

## 2016-02-27 ENCOUNTER — Other Ambulatory Visit: Payer: Self-pay | Admitting: Advanced Practice Midwife

## 2016-02-27 VITALS — BP 132/95 | HR 101 | Wt 297.5 lb

## 2016-02-27 DIAGNOSIS — N3001 Acute cystitis with hematuria: Secondary | ICD-10-CM | POA: Diagnosis not present

## 2016-02-27 DIAGNOSIS — Z23 Encounter for immunization: Secondary | ICD-10-CM

## 2016-02-27 DIAGNOSIS — N921 Excessive and frequent menstruation with irregular cycle: Secondary | ICD-10-CM

## 2016-02-27 DIAGNOSIS — R300 Dysuria: Secondary | ICD-10-CM

## 2016-02-27 DIAGNOSIS — Z113 Encounter for screening for infections with a predominantly sexual mode of transmission: Secondary | ICD-10-CM | POA: Diagnosis present

## 2016-02-27 DIAGNOSIS — Z975 Presence of (intrauterine) contraceptive device: Secondary | ICD-10-CM | POA: Diagnosis not present

## 2016-02-27 LAB — POCT URINALYSIS DIP (DEVICE)
BILIRUBIN URINE: NEGATIVE
Glucose, UA: NEGATIVE mg/dL
KETONES UR: NEGATIVE mg/dL
Nitrite: NEGATIVE
PH: 7.5 (ref 5.0–8.0)
SPECIFIC GRAVITY, URINE: 1.025 (ref 1.005–1.030)
Urobilinogen, UA: 0.2 mg/dL (ref 0.0–1.0)

## 2016-02-27 MED ORDER — IBUPROFEN 600 MG PO TABS: 600.0000 mg | ORAL_TABLET | Freq: Four times a day (QID) | ORAL | 1 refills | Status: AC | PRN

## 2016-02-27 MED ORDER — PHENAZOPYRIDINE HCL 200 MG PO TABS: 200.0000 mg | ORAL_TABLET | Freq: Three times a day (TID) | ORAL | 0 refills | Status: DC | PRN

## 2016-02-27 MED ORDER — SULFAMETHOXAZOLE-TRIMETHOPRIM 800-160 MG PO TABS: 1.0000 | ORAL_TABLET | Freq: Two times a day (BID) | ORAL | 0 refills | Status: DC

## 2016-02-27 NOTE — Patient Instructions (Addendum)
Urinary Tract Infection, Adult A urinary tract infection (UTI) is an infection of any part of the urinary tract, which includes the kidneys, ureters, bladder, and urethra. These organs make, store, and get rid of urine in the body. UTI can be a bladder infection (cystitis) or kidney infection (pyelonephritis). What are the causes? This infection may be caused by fungi, viruses, or bacteria. Bacteria are the most common cause of UTIs. This condition can also be caused by repeated incomplete emptying of the bladder during urination. What increases the risk? This condition is more likely to develop if:  You ignore your need to urinate or hold urine for long periods of time.  You do not empty your bladder completely during urination.  You wipe back to front after urinating or having a bowel movement, if you are female.  You are uncircumcised, if you are female.  You are constipated.  You have a urinary catheter that stays in place (indwelling).  You have a weak defense (immune) system.  You have a medical condition that affects your bowels, kidneys, or bladder.  You have diabetes.  You take antibiotic medicines frequently or for long periods of time, and the antibiotics no longer work well against certain types of infections (antibiotic resistance).  You take medicines that irritate your urinary tract.  You are exposed to chemicals that irritate your urinary tract.  You are female. What are the signs or symptoms? Symptoms of this condition include:  Fever.  Frequent urination or passing small amounts of urine frequently.  Needing to urinate urgently.  Pain or burning with urination.  Urine that smells bad or unusual.  Cloudy urine.  Pain in the lower abdomen or back.  Trouble urinating.  Blood in the urine.  Vomiting or being less hungry than normal.  Diarrhea or abdominal pain.  Vaginal discharge, if you are female. How is this diagnosed? This condition is  diagnosed with a medical history and physical exam. You will also need to provide a urine sample to test your urine. Other tests may be done, including:  Blood tests.  Sexually transmitted disease (STD) testing. If you have had more than one UTI, a cystoscopy or imaging studies may be done to determine the cause of the infections. How is this treated? Treatment for this condition often includes a combination of two or more of the following:  Antibiotic medicine.  Other medicines to treat less common causes of UTI.  Over-the-counter medicines to treat pain.  Drinking enough water to stay hydrated. Follow these instructions at home:  Take over-the-counter and prescription medicines only as told by your health care provider.  If you were prescribed an antibiotic, take it as told by your health care provider. Do not stop taking the antibiotic even if you start to feel better.  Avoid alcohol, caffeine, tea, and carbonated beverages. They can irritate your bladder.  Drink enough fluid to keep your urine clear or pale yellow.  Keep all follow-up visits as told by your health care provider. This is important.  Make sure to:  Empty your bladder often and completely. Do not hold urine for long periods of time.  Empty your bladder before and after sex.  Wipe from front to back after a bowel movement if you are female. Use each tissue one time when you wipe. Contact a health care provider if:  You have back pain.  You have a fever.  You feel nauseous or vomit.  Your symptoms do not get better after 3   days.  Your symptoms go away and then return. Get help right away if:  You have severe back pain or lower abdominal pain.  You are vomiting and cannot keep down any medicines or water. This information is not intended to replace advice given to you by your health care provider. Make sure you discuss any questions you have with your health care provider. Document Released:  11/07/2004 Document Revised: 07/12/2015 Document Reviewed: 12/19/2014 Elsevier Interactive Patient Education  2017 Elsevier Inc.   Hypertension Hypertension, commonly called high blood pressure, is when the force of blood pumping through your arteries is too strong. Your arteries are the blood vessels that carry blood from your heart throughout your body. A blood pressure reading consists of a higher number over a lower number, such as 110/72. The higher number (systolic) is the pressure inside your arteries when your heart pumps. The lower number (diastolic) is the pressure inside your arteries when your heart relaxes. Ideally you want your blood pressure below 120/80. Hypertension forces your heart to work harder to pump blood. Your arteries may become narrow or stiff. Having untreated or uncontrolled hypertension can cause heart attack, stroke, kidney disease, and other problems. What increases the risk? Some risk factors for high blood pressure are controllable. Others are not. Risk factors you cannot control include:  Race. You may be at higher risk if you are African American.  Age. Risk increases with age.  Gender. Men are at higher risk than women before age 9 years. After age 2, women are at higher risk than men. Risk factors you can control include:  Not getting enough exercise or physical activity.  Being overweight.  Getting too much fat, sugar, calories, or salt in your diet.  Drinking too much alcohol. What are the signs or symptoms? Hypertension does not usually cause signs or symptoms. Extremely high blood pressure (hypertensive crisis) may cause headache, anxiety, shortness of breath, and nosebleed. How is this diagnosed? To check if you have hypertension, your health care provider will measure your blood pressure while you are seated, with your arm held at the level of your heart. It should be measured at least twice using the same arm. Certain conditions can cause a  difference in blood pressure between your right and left arms. A blood pressure reading that is higher than normal on one occasion does not mean that you need treatment. If it is not clear whether you have high blood pressure, you may be asked to return on a different day to have your blood pressure checked again. Or, you may be asked to monitor your blood pressure at home for 1 or more weeks. How is this treated? Treating high blood pressure includes making lifestyle changes and possibly taking medicine. Living a healthy lifestyle can help lower high blood pressure. You may need to change some of your habits. Lifestyle changes may include:  Following the DASH diet. This diet is high in fruits, vegetables, and whole grains. It is low in salt, red meat, and added sugars.  Keep your sodium intake below 2,300 mg per day.  Getting at least 30-45 minutes of aerobic exercise at least 4 times per week.  Losing weight if necessary.  Not smoking.  Limiting alcoholic beverages.  Learning ways to reduce stress. Your health care provider may prescribe medicine if lifestyle changes are not enough to get your blood pressure under control, and if one of the following is true:  You are 13-28 years of age and your systolic  blood pressure is above 140.  You are 32 years of age or older, and your systolic blood pressure is above 150.  Your diastolic blood pressure is above 90.  You have diabetes, and your systolic blood pressure is over 140 or your diastolic blood pressure is over 90.  You have kidney disease and your blood pressure is above 140/90.  You have heart disease and your blood pressure is above 140/90. Your personal target blood pressure may vary depending on your medical conditions, your age, and other factors. Follow these instructions at home:  Have your blood pressure rechecked as directed by your health care provider.  Take medicines only as directed by your health care provider.  Follow the directions carefully. Blood pressure medicines must be taken as prescribed. The medicine does not work as well when you skip doses. Skipping doses also puts you at risk for problems.  Do not smoke.  Monitor your blood pressure at home as directed by your health care provider. Contact a health care provider if:  You think you are having a reaction to medicines taken.  You have recurrent headaches or feel dizzy.  You have swelling in your ankles.  You have trouble with your vision. Get help right away if:  You develop a severe headache or confusion.  You have unusual weakness, numbness, or feel faint.  You have severe chest or abdominal pain.  You vomit repeatedly.  You have trouble breathing. This information is not intended to replace advice given to you by your health care provider. Make sure you discuss any questions you have with your health care provider. Document Released: 01/28/2005 Document Revised: 07/06/2015 Document Reviewed: 11/20/2012 Elsevier Interactive Patient Education  2017 [employer].   Steps to Quit Smoking Smoking tobacco can be harmful to your health and can affect almost every organ in your body. Smoking puts you, and those around you, at risk for developing many serious chronic diseases. Quitting smoking is difficult, but it is one of the best things that you can do for your health. It is never too late to quit. What are the benefits of quitting smoking? When you quit smoking, you lower your risk of developing serious diseases and conditions, such as:  Lung cancer or lung disease, such as COPD.  Heart disease.  Stroke.  Heart attack.  Infertility.  Osteoporosis and bone fractures. Additionally, symptoms such as coughing, wheezing, and shortness of breath may get better when you quit. You may also find that you get sick less often because your body is stronger at fighting off colds and infections. If you are pregnant, quitting smoking  can help to reduce your chances of having a baby of low birth weight. How do I get ready to quit? When you decide to quit smoking, create a plan to make sure that you are successful. Before you quit:  Pick a date to quit. Set a date within the next two weeks to give you time to prepare.  Write down the reasons why you are quitting. Keep this list in places where you will see it often, such as on your bathroom mirror or in your car or wallet.  Identify the people, places, things, and activities that make you want to smoke (triggers) and avoid them. Make sure to take these actions:  Throw away all cigarettes at home, at work, and in your car.  Throw away smoking accessories, such as Set designer.  Clean your car and make sure to empty the ashtray.  Clean your home, including curtains and carpets.  Tell your family, friends, and coworkers that you are quitting. Support from your loved ones can make quitting easier.  Talk with your health care provider about your options for quitting smoking.  Find out what treatment options are covered by your health insurance. What strategies can I use to quit smoking? Talk with your healthcare provider about different strategies to quit smoking. Some strategies include:  Quitting smoking altogether instead of gradually lessening how much you smoke over a period of time. Research shows that quitting "cold [place]key" is more successful than gradually quitting.  Attending in-person counseling to help you build problem-solving skills. You are more likely to have success in quitting if you attend several counseling sessions. Even short sessions of 10 minutes can be effective.  Finding resources and support systems that can help you to quit smoking and remain smoke-free after you quit. These resources are most helpful when you use them often. They can include:  Online chats with a Veterinary surgeoncounselor.  Telephone quitlines.  Printed Chief Executive Officerself-help  materials.  Support groups or group counseling.  Text messaging programs.  Mobile phone applications.  Taking medicines to help you quit smoking. (If you are pregnant or breastfeeding, talk with your health care provider first.) Some medicines contain nicotine and some do not. Both types of medicines help with cravings, but the medicines that include nicotine help to relieve withdrawal symptoms. Your health care provider may recommend:  Nicotine patches, gum, or lozenges.  Nicotine inhalers or sprays.  Non-nicotine medicine that is taken by mouth. Talk with your health care provider about combining strategies, such as taking medicines while you are also receiving in-person counseling. Using these two strategies together makes you more likely to succeed in quitting than if you used either strategy on its own. If you are pregnant or breastfeeding, talk with your health care provider about finding counseling or other support strategies to quit smoking. Do not take medicine to help you quit smoking unless told to do so by your health care provider. What things can I do to make it easier to quit? Quitting smoking might feel overwhelming at first, but there is a lot that you can do to make it easier. Take these important actions:  Reach out to your family and friends and ask that they support and encourage you during this time. Call telephone quitlines, reach out to support groups, or work with a counselor for support.  Ask people who smoke to avoid smoking around you.  Avoid places that trigger you to smoke, such as bars, parties, or smoke-break areas at work.  Spend time around people who do not smoke.  Lessen stress in your life, because stress can be a smoking trigger for some people. To lessen stress, try:  Exercising regularly.  Deep-breathing exercises.  Yoga.  Meditating.  Performing a body scan. This involves closing your eyes, scanning your body from head to toe, and  noticing which parts of your body are particularly tense. Purposefully relax the muscles in those areas.  Download or purchase mobile phone or tablet apps (applications) that can help you stick to your quit plan by providing reminders, tips, and encouragement. There are many free apps, such as QuitGuide from the [employer] developer(Centers for Disease Control and Prevention). You can find other support for quitting smoking (smoking cessation) through smokefree.gov and other websites. How will I feel when I quit smoking? Within the first 24 hours of quitting smoking, you may start to feel  some withdrawal symptoms. These symptoms are usually most noticeable 2-3 days after quitting, but they usually do not last beyond 2-3 weeks. Changes or symptoms that you might experience include:  Mood swings.  Restlessness, anxiety, or irritation.  Difficulty concentrating.  Dizziness.  Strong cravings for sugary foods in addition to nicotine.  Mild weight gain.  Constipation.  Nausea.  Coughing or a sore throat.  Changes in how your medicines work in your body.  A depressed mood.  Difficulty sleeping (insomnia). After the first 2-3 weeks of quitting, you may start to notice more positive results, such as:  Improved sense of smell and taste.  Decreased coughing and sore throat.  Slower heart rate.  Lower blood pressure.  Clearer skin.  The ability to breathe more easily.  Fewer sick days. Quitting smoking is very challenging for most people. Do not get discouraged if you are not successful the first time. Some people need to make many attempts to quit before they achieve long-term success. Do your best to stick to your quit plan, and talk with your health care provider if you have any questions or concerns. This information is not intended to replace advice given to you by your health care provider. Make sure you discuss any questions you have with your health care provider. Document Released:  01/22/2001 Document Revised: 09/26/2015 Document Reviewed: 06/14/2014 Elsevier Interactive Patient Education  2017 [employer].

## 2016-02-27 NOTE — Addendum Note (Signed)
Addended by: Cheree DittoGRAHAM, DEMETRICE A on: 02/27/2016 02:15 PM   Modules accepted: Orders

## 2016-02-27 NOTE — Progress Notes (Addendum)
Subjective:     Patient ID: Alicia Simon, female   DOB: [birth year], 23 y.o.   MRN: [id number]  HPI Pr is a 21 year-old G2P2 w/ IUD placed 01/2015 who reports seeing blood when she wipes, dysuria, urinary frequency and urgency, vaginal itching and abd pain x 2 days. Unsure if blood is from vagina or in urine. Denies fever, chills, flank pain, vaginal discharge. Tried OTC Monistat w/ no improvement.    Has normal periods w/ IUD in place, but period is not due for two more weeks. States pain and bleeding are not like her normal periods.    Review of Systems  Constitutional: Negative for chills and fever.  Gastrointestinal: Positive for abdominal pain. Negative for constipation, diarrhea, nausea and vomiting.  Genitourinary: Positive for dysuria, frequency, hematuria, urgency and vaginal bleeding. Negative for flank pain, genital sores, vaginal discharge and vaginal pain.       Objective:   Physical Exam  Constitutional: She is oriented to person, place, and time. She appears well-developed and well-nourished. No distress.  Cardiovascular: Normal rate.   Pulmonary/Chest: Effort normal. No respiratory distress.  Abdominal: Soft. She exhibits no distension. There is no tenderness. There is no guarding.  Genitourinary: There is no rash, tenderness or lesion on the right labia. There is no rash, tenderness or lesion on the left labia. Uterus is not enlarged and not tender. Cervix exhibits no motion tenderness, no discharge and no friability. Right adnexum displays no mass and no tenderness. Left adnexum displays no mass and no tenderness. There is bleeding in the vagina. No tenderness in the vagina. No signs of injury around the vagina. No vaginal discharge found.    Neurological: She is alert and oriented to person, place, and time.  Skin: Skin is warm and dry.  Psychiatric: She has a normal mood and affect.  Nursing note and vitals reviewed.      Assessment:    1. Dysuria  -  GC/Chlamydia probe amp [facility] - Wet prep, genital - POCT urinalysis dip (device) - Urine culture - sulfamethoxazole-trimethoprim (BACTRIM DS,SEPTRA DS) 800-160 MG tablet; Take 1 tablet by mouth 2 (two) times daily.  Dispense: 6 tablet; Refill: 0 - phenazopyridine (PYRIDIUM) 200 MG tablet; Take 1 tablet (200 mg total) by mouth 3 (three) times daily as needed for pain.  Dispense: 12 tablet; Refill: 0  2. Acute cystitis with hematuria  - GC[facility]Chlamydia [facility] - Wet prep, genital - POCT urinalysis dip (device) - Urine culture - sulfamethoxazole-trimethoprim (BACTRIM DS,SEPTRA DS) 800-160 MG tablet; Take 1 tablet by mouth 2 (two) times daily.  Dispense: 6 tablet; Refill: 0 - phenazopyridine (PYRIDIUM) 200 MG tablet; Take 1 tablet (200 mg total) by mouth 3 (three) times daily as needed for pain.  Dispense: 12 tablet; Refill: 0  3. Breakthrough bleeding with IUD  - GC[facility]lamydia probe amp ()[facility] - Wet prep, genital - POCT urinalysis dip (device) - Urine culture - sulfamethoxazole-trimethoprim (BACTRIM DS,SEPTRA DS) 800-160 MG tablet; Take 1 tablet by mouth 2 (two) times daily.  Dispense: 6 tablet; Refill: 0 - phenazopyridine (PYRIDIUM) 200 MG tablet; Take 1 tablet (200 mg total) by mouth 3 (three) times daily as needed for pain.  Dispense: 12 tablet; Refill: 0   4. [facility] - F/U w/ PCP   Plan:     If no improvement after Tx for UTI and any positive results from Wet Prep, GC.Chlmaydia will order OP US to check IUD placement.  [place], CNM 02/27/2016 1:58 PM

## 2016-02-27 NOTE — Addendum Note (Signed)
Addended by: Dorathy KinsmanSMITH, Romani Wilbon on: 02/27/2016 02:10 PM   Modules accepted: Orders

## 2016-02-28 LAB — WET PREP, GENITAL
CLUE CELLS WET PREP: NONE SEEN
TRICH WET PREP: NONE SEEN
WBC, Wet Prep HPF POC: NONE SEEN
YEAST WET PREP: NONE SEEN

## 2016-02-28 LAB — POCT PREGNANCY, URINE: PREG TEST UR: NEGATIVE

## 2016-02-29 LAB — GC/CHLAMYDIA PROBE AMP (~~LOC~~) NOT AT ARMC
Chlamydia: NEGATIVE
NEISSERIA GONORRHEA: NEGATIVE

## 2016-03-02 LAB — URINE CULTURE: Colony Count: >=100000

## 2016-03-04 ENCOUNTER — Other Ambulatory Visit: Payer: Self-pay | Admitting: Advanced Practice Midwife

## 2016-03-04 DIAGNOSIS — N3001 Acute cystitis with hematuria: Secondary | ICD-10-CM

## 2016-03-04 MED ORDER — CIPROFLOXACIN HCL 500 MG PO TABS: 500.0000 mg | ORAL_TABLET | Freq: Two times a day (BID) | ORAL | 0 refills | Status: DC

## 2016-03-04 NOTE — Progress Notes (Signed)
Please inform pt of need to change ABX for UTI from Bactrim to Cipro due to resistance.

## 2016-03-04 NOTE — Progress Notes (Signed)
I called Alicia Simon and left a message we are trying to reach you with some information. Please call our office.

## 2016-03-05 ENCOUNTER — Telehealth: Payer: Self-pay | Admitting: *Deleted

## 2016-03-05 NOTE — Progress Notes (Signed)
I called pt and informed her of need for alternate antibiotic to treat her UTI.  The one she was given initially will not cure the infection. She stated that she received a text from her pharmacy yesterday, has picked up the new medication and is now taking it. She voiced understanding of all information and had no questions.

## 2016-03-05 NOTE — Telephone Encounter (Signed)
Patient left message on nurse voicemail on 03/04/16 at 1114.  States she is returning our call.  Requests a return call to [phone].

## 2016-03-05 NOTE — Telephone Encounter (Signed)
Returned call to pt - see note in another encounter.

## 2016-04-11 ENCOUNTER — Emergency Department (HOSPITAL_COMMUNITY)
Admission: EM | Admit: 2016-04-11 | Discharge: 2016-04-11 | Disposition: A | Discharge status: Home / Self Care | Payer: Medicaid Other | Attending: Emergency Medicine | Admitting: Emergency Medicine

## 2016-04-11 ENCOUNTER — Encounter (HOSPITAL_COMMUNITY): Payer: Self-pay | Admitting: Emergency Medicine

## 2016-04-11 DIAGNOSIS — L03211 Cellulitis of face: Secondary | ICD-10-CM

## 2016-04-11 DIAGNOSIS — K0889 Other specified disorders of teeth and supporting structures: Secondary | ICD-10-CM | POA: Diagnosis present

## 2016-04-11 DIAGNOSIS — I10 Essential (primary) hypertension: Secondary | ICD-10-CM | POA: Diagnosis not present

## 2016-04-11 DIAGNOSIS — Z79899 Other long term (current) drug therapy: Secondary | ICD-10-CM | POA: Diagnosis not present

## 2016-04-11 DIAGNOSIS — F17210 Nicotine dependence, cigarettes, uncomplicated: Secondary | ICD-10-CM | POA: Insufficient documentation

## 2016-04-11 MED ORDER — IBUPROFEN 200 MG PO TABS
600.0000 mg | ORAL_TABLET | Freq: Once | ORAL | Status: AC
  Administered 2016-04-11: 600 mg via ORAL
  Filled 2016-04-11: qty 3

## 2016-04-11 MED ORDER — ACETAMINOPHEN 500 MG PO TABS
1000.0000 mg | ORAL_TABLET | Freq: Once | ORAL | Status: AC
  Administered 2016-04-11: 1000 mg via ORAL
  Filled 2016-04-11: qty 2

## 2016-04-11 MED ORDER — CLINDAMYCIN HCL 150 MG PO CAPS: 450.0000 mg | ORAL_CAPSULE | Freq: Three times a day (TID) | ORAL | 0 refills | Status: AC

## 2016-04-11 NOTE — Discharge Instructions (Signed)
The ultrasound we did today showed cellulitis and a very small abscess.  Please take antibiotic as prescribed until completed. Do hot compresses multiple times a day as often as possible. Take ibuprofen 600 mg three times a day for inflammation and pain. Monitor your swelling and return in 48 hours for re-evaluation.  [facility] to establish care with a primary care provider for routine medical care,and if able to, you can go there for re-evaluation in 48 hours.  Return earlier if you notice significant swelling or have difficulty swallowing or controlling your oral secretions. Please see dental guide resource to establish care with a dentist for routine dental care and braces evaluation.

## 2016-04-11 NOTE — ED Provider Notes (Signed)
[facility]-EMERGENCY DEPT Provider Note   CSN: [id number] Arrival date & time: 04/11/16  1317  By signing my name below, I, Freida Busman, attest that this documentation has been prepared under the direction and in the presence of Sharen Heck, PA-C. Electronically Signed: Freida Busman, Scribe. 04/11/2016. 3:19 PM  History   Chief Complaint Chief Complaint  Patient presents with  . Dental Pain     The history is provided by the patient. No language interpreter was used.    HPI Comments:  Alicia Simon is a 21 y.o. female who presents to the Emergency Department complaining of 10/10 right sided dental pain x 2-3 days. Pt reports associated right sided facial swelling since last night. She has a h/o similar pain x ~ 1 year that often resolves with ibuprofen within a 2 day period. She has been evaluated in the past by a dentist and was told she had a tooth in the area that needed extraction but was unable to due to her braces. Pt is a current smoker.   Past Medical History:  Diagnosis Date  . Hypertension     There are no active problems to display for this patient.   Past Surgical History:  Procedure Laterality Date  . WISDOM TOOTH EXTRACTION      OB History    Gravida Para Term Preterm AB Living   2 2 2  0 0 2   SAB TAB Ectopic Multiple Live Births   0 0 0 0 2       Home Medications    Prior to Admission medications   Medication Sig Start Date End Date Taking? Authorizing Provider  acetaminophen (TYLENOL) 500 MG tablet Take 500 mg by mouth every 6 (six) hours as needed for mild pain.     Historical Provider, MD  ciprofloxacin (CIPRO) 500 MG tablet Take 1 tablet (500 mg total) by mouth 2 (two) times daily. Patient not taking: Reported on 04/14/2016 03/04/16   [name], CNM  clindamycin (CLEOCIN) 150 MG capsule Take 3 capsules (450 mg total) by mouth 3 (three) times daily. 04/11/16 04/18/16  Liberty Handy, PA-C  HYDROcodone-acetaminophen (NORCO) 5-325 MG tablet Take  1 tablet by mouth every 4 (four) hours as needed. 04/14/16   Mancel Bale, MD  ibuprofen (ADVIL,MOTRIN) 600 MG tablet Take 1 tablet (600 mg total) by mouth every 6 (six) hours as needed. 02/27/16   [name], CNM  naproxen sodium (ANAPROX) 220 MG tablet Take 220 mg by mouth 2 (two) times daily as needed (pain, headache).    Historical Provider, MD  phenazopyridine (PYRIDIUM) 200 MG tablet TAKE 1 TABLET(200 MG) BY MOUTH THREE TIMES DAILY AS NEEDED FOR PAIN Patient not taking: Reported on 04/14/2016 02/27/16   [name], CNM    Family History History reviewed. No pertinent family history.  Social History Social History  Substance Use Topics  . Smoking status: Current Every Day Smoker    Packs/day: 0.25    Years: 0.50    Types: Cigarettes  . Smokeless tobacco: Never Used  . Alcohol use No     Allergies   Patient has no known allergies.   Review of Systems Review of Systems  Constitutional: Negative for fever.  HENT: Positive for dental problem and facial swelling.      Physical Exam Updated Vital Signs BP 140/80 (BP Location: Right Arm)   Pulse 88   Temp 98.8 F (37.1 C) (Oral)   Resp 18   SpO2 100%   Physical Exam  Constitutional: She is oriented to person, place, and time. She appears well-developed and well-nourished. No distress.  HENT:  Head: Normocephalic and atraumatic.  Right Ear: External ear normal.  Left Ear: External ear normal.  Nose: Nose normal.  Mouth/Throat: Oropharynx is clear and moist. No oropharyngeal exudate.  Pt with upper and lower braces  Tenderness to inside of the right lower cheek with edema and  slight fluctuance and induration. Right lower posterior tooth is without surrounding gingival  erythema, edema or signs of abscess   Obvious right lower jaw swelling with mild loss of mandible angle No gum fluctuance or erythema. No facial or anterior neck erythema. No sublingual edema or tenderness.  Soft palate flat without tenderness.    No trismus.   No pooling of oral secretions.  Phonation normal, no hot potato voice.  Maxilla and mandible nontender. Mastoids without edema, erythema or tenderness.    Eyes: Conjunctivae and EOM are normal. Pupils are equal, round, and reactive to light. No scleral icterus.  Neck: Normal range of motion. Neck supple. No JVD present.  Cardiovascular: Normal rate, regular rhythm and normal heart sounds.   No murmur heard. Pulmonary/Chest: Effort normal and breath sounds normal. She has no wheezes.  Abdominal: Soft. There is no tenderness.  Musculoskeletal: Normal range of motion. She exhibits no deformity.  Lymphadenopathy:    She has no cervical adenopathy.  Neurological: She is alert and oriented to person, place, and time.  Skin: Skin is warm and dry. Capillary refill takes less than 2 seconds.  Psychiatric: She has a normal mood and affect. Her behavior is normal. Judgment and thought content normal.  Nursing note and vitals reviewed.    ED Treatments / Results  DIAGNOSTIC STUDIES:  Oxygen Saturation is 100% on RA, normal by my interpretation.    COORDINATION OF CARE:  2:53 PM Discussed treatment plan with pt at bedside and pt agreed to plan.  Labs (all labs ordered are listed, but only abnormal results are displayed) Labs Reviewed - No data to display  EKG  EKG Interpretation None       Radiology No results found.  Procedures US bedside Date/Time: 04/11/2016 3:20 PM Performed by: Liberty HandyGIBBONS, Annalissa Murphey J Authorized by: Liberty HandyGIBBONS, Yvonne Petite J  Consent: Verbal consent obtained. Consent given by: patient Imaging studies: imaging studies available Local anesthesia used: no  Anesthesia: Local anesthesia used: no Patient tolerance: Patient tolerated the procedure well with no immediate complications Comments: US of right cheeck obtained. Revealed mostly cobblestoning with small abscess deep below.    (including critical care time)  Medications Ordered in  ED Medications  ibuprofen (ADVIL,MOTRIN) tablet 600 mg (600 mg Oral Given 04/11/16 1455)  acetaminophen (TYLENOL) tablet 1,000 mg (1,000 mg Oral Given 04/11/16 1455)     Initial Impression / Assessment and Plan / ED Course  I have reviewed the triage vital signs and the nursing notes.  Pertinent labs & imaging results that were available during my care of the patient were reviewed by me and considered in my medical decision making (see chart for details).     Patient with dentalgia but is really here for right lower cheek skin swelling and tenderness since last night. US obtained at bedside revealed mostly cellulitis with very tiny area of fluid deep below cobblestoning. Suspect early stages of facial cellulitis   No abscess requiring immediate incision and drainage especially because it is on face and I do not think I would obtain drainage at this time.  Exam not concerning  for Ludwig's angina or pharyngeal abscess.  Patient is afebrile, non toxic appearing, tolerating oral secretions without changes in voice.  Will treat with antibiotics and aggressive hot compresses. Pt instructed to follow-up with dentist for cracked tooth repair as soon as possible and PCP in 24-48 hours for reevaluation of cellulitis/ abscess progression. Discussed return precautions, pt is aware of red flag symptoms that would warrant return to ED. Pt safe for discharge.  Final Clinical Impressions(s) / ED Diagnoses   Final diagnoses:  Facial cellulitis    New Prescriptions Discharge Medication List as of 04/11/2016  3:28 PM    START taking these medications   Details  clindamycin (CLEOCIN) 150 MG capsule Take 3 capsules (450 mg total) by mouth 3 (three) times daily., Starting Thu 04/11/2016, Until Thu 04/18/2016, Print       I personally performed the services described in this documentation, which was scribed in my presence. The recorded information has been reviewed and is accurate.     Liberty Handy,  PA-C 04/14/16 2308    Mancel Bale, MD 04/15/16 [phone]

## 2016-04-11 NOTE — ED Triage Notes (Signed)
Patient c/o intermittent right lower dental pain x2 years but new onset pain and swelling x2 days. States "the tooth needs to be removed but they won't do it with my braces." Denies fevers. Ambulatory to triage.

## 2016-04-11 NOTE — ED Notes (Signed)
Swelling to right side of pts face, pt states she needs to have a tooth removed

## 2016-04-14 ENCOUNTER — Encounter (HOSPITAL_COMMUNITY): Payer: Self-pay

## 2016-04-14 ENCOUNTER — Emergency Department (HOSPITAL_COMMUNITY)
Admission: EM | Admit: 2016-04-14 | Discharge: 2016-04-14 | Disposition: A | Discharge status: Home / Self Care | Payer: Medicaid Other | Attending: Emergency Medicine | Admitting: Emergency Medicine

## 2016-04-14 DIAGNOSIS — K047 Periapical abscess without sinus: Secondary | ICD-10-CM | POA: Diagnosis not present

## 2016-04-14 DIAGNOSIS — F17210 Nicotine dependence, cigarettes, uncomplicated: Secondary | ICD-10-CM | POA: Insufficient documentation

## 2016-04-14 DIAGNOSIS — I10 Essential (primary) hypertension: Secondary | ICD-10-CM | POA: Diagnosis not present

## 2016-04-14 DIAGNOSIS — R220 Localized swelling, mass and lump, head: Secondary | ICD-10-CM | POA: Diagnosis present

## 2016-04-14 MED ORDER — HYDROCODONE-ACETAMINOPHEN 5-325 MG PO TABS: 1.0000 | ORAL_TABLET | ORAL | 0 refills | Status: AC | PRN

## 2016-04-14 NOTE — ED Triage Notes (Signed)
Pt here with facial swelling to rt cheek.  Pt seen 3 days ago for same.  Pt wears braces and has infection and abscess in rt cheek.  Told to return if abx tx not helping.

## 2016-04-14 NOTE — ED Notes (Signed)
Called for patient.  Not found in lobby 

## 2016-04-14 NOTE — Discharge Instructions (Signed)
The abscess is currently draining.  It is very important to use heat on the sore area frequently such as 30 minutes every hour while you are awake.  This will tend to improve the swelling and decrease pain.  Heat will also encourage drainage was helps to get the infection out.  It is important to follow-up with a dentist, or orthodontist as soon as possible for ongoing care.   You should notice gradual improvement of the discomfort over the next 4-5 days, but complete healing will likely take 2 weeks.  Return here, if needed, for problems.

## 2016-04-14 NOTE — ED Provider Notes (Signed)
[facility]-EMERGENCY DEPT Provider Note   CSN: [id number] Arrival date & time: 04/14/16  1257     History   Chief Complaint Chief Complaint  Patient presents with  . Facial Swelling    HPI Alicia Simon is a 21 y.o. female.  She is here for evaluation of swelling right jaw, pus draining from a wound in her mouth and pain in her right jaw.  Pain is worse when she tries to open her mouth.  She denies fever chills weakness or dizziness.  She is taking clindamycin as prescribed on 04/11/2016 for an intraoral infection.  She is not using heat on the sore area because it "makes me hurt".  No prior similar problem.  There are no other known modifying factors.  HPI  Past Medical History:  Diagnosis Date  . Hypertension     There are no active problems to display for this patient.   Past Surgical History:  Procedure Laterality Date  . WISDOM TOOTH EXTRACTION      OB History    Gravida Para Term Preterm AB Living   2 2 2  0 0 2   SAB TAB Ectopic Multiple Live Births   0 0 0 0 2       Home Medications    Prior to Admission medications   Medication Sig Start Date End Date Taking? Authorizing Provider  acetaminophen (TYLENOL) 500 MG tablet Take 500 mg by mouth every 6 (six) hours as needed for mild pain.    Yes Historical Provider, MD  clindamycin (CLEOCIN) 150 MG capsule Take 3 capsules (450 mg total) by mouth 3 (three) times daily. 04/11/16 04/18/16 Yes Liberty Handy, PA-C  ibuprofen (ADVIL,MOTRIN) 600 MG tablet Take 1 tablet (600 mg total) by mouth every 6 (six) hours as needed. 02/27/16  Yes [name], CNM  naproxen sodium (ANAPROX) 220 MG tablet Take 220 mg by mouth 2 (two) times daily as needed (pain, headache).   Yes Historical Provider, MD  ciprofloxacin (CIPRO) 500 MG tablet Take 1 tablet (500 mg total) by mouth 2 (two) times daily. Patient not taking: Reported on 04/14/2016 03/04/16   [name], CNM  phenazopyridine (PYRIDIUM) 200 MG tablet TAKE 1 TABLET(200 MG) BY  MOUTH THREE TIMES DAILY AS NEEDED FOR PAIN Patient not taking: Reported on 04/14/2016 02/27/16   [name], CNM    Family History History reviewed. No pertinent family history.  Social History Social History  Substance Use Topics  . Smoking status: Current Every Day Smoker    Packs/day: 0.25    Years: 0.50    Types: Cigarettes  . Smokeless tobacco: Never Used  . Alcohol use No     Allergies   Patient has no known allergies.   Review of Systems Review of Systems  All other systems reviewed and are negative.    Physical Exam Updated Vital Signs BP 149/83 (BP Location: Right Arm)   Pulse 82   Temp 98.1 F (36.7 C) (Oral)   Resp 18   Ht 5' 3.5" (1.613 m)   Wt 297 lb (134.7 kg)   SpO2 100%   BMI 51.79 kg/m   Physical Exam  Constitutional: She is oriented to person, place, and time. She appears well-developed.  Morbidly obese  HENT:  Head: Normocephalic and atraumatic.  Tender right angle of jaw, with swelling.  Mild trismus.  Intraoral exam reveals draining wound, adjacent to the rear molar, on the buccal surface.  Oral airway is intact.  Eyes: Conjunctivae and EOM are  normal. Pupils are equal, round, and reactive to light.  Neck: Normal range of motion and phonation normal. Neck supple.  Cardiovascular: Normal rate.   Pulmonary/Chest: Effort normal.  Musculoskeletal: Normal range of motion.  Neurological: She is alert and oriented to person, place, and time. She exhibits normal muscle tone.  Skin: Skin is warm and dry.  Psychiatric: She has a normal mood and affect. Her behavior is normal. Judgment and thought content normal.  Nursing note and vitals reviewed.    ED Treatments / Results  Labs (all labs ordered are listed, but only abnormal results are displayed) Labs Reviewed - No data to display  EKG  EKG Interpretation None       Radiology No results found.  Procedures Procedures (including critical care time)  Medications Ordered in  ED Medications - No data to display   Initial Impression / Assessment and Plan / ED Course  I have reviewed the triage vital signs and the nursing notes.  Pertinent labs & imaging results that were available during my care of the patient were reviewed by me and considered in my medical decision making (see chart for details).     Medications - No data to display  Patient Vitals for the past 24 hrs:  BP Temp Temp src Pulse Resp SpO2 Height Weight  04/14/16 1635 149/83 - - 82 18 100 % - -  04/14/16 1307 147/82 98.1 F (36.7 C) Oral 85 18 100 % 5' 3.5" (1.613 m) 297 lb (134.7 kg)    5:15 PM Reevaluation with update and discussion. After initial assessment and treatment, an updated evaluation reveals no change in clinical status.  Findings discussed with patient and all questions answered. Swati Granberry L   Final Clinical Impressions(s) / ED Diagnoses   Final diagnoses:  Dental abscess   Dental infection unspecified, without airway compromise.  Patient is nontoxic.  Her infection is draining appropriately.  She has not been using heat on the sore area.  Doubt systemic infection, airway compromise or impending vascular collapse.  Nursing Notes Reviewed/ Care Coordinated Applicable Imaging Reviewed Interpretation of Laboratory Data incorporated into ED treatment  The patient appears reasonably screened and/or stabilized for discharge and I doubt any other medical condition or other [facility] requiring further screening, evaluation, or treatment in the ED at this time prior to discharge.  Plan: Home Medications-continue clindamycin; Home Treatments-aggressive heat therapy; return here if the recommended treatment, does not improve the symptoms; Recommended follow up-dental follow-up as soon as possible   New Prescriptions New Prescriptions   No medications on file     Mancel BaleElliott Demitri Kucinski, MD 04/14/16 1721

## 2016-10-25 IMAGING — US US MFM OB FOLLOW-UP
2 series · 14 of 28 positions shown · non-contrast
Comparison: none

[Series 1: us mfm ob follow-up · 10 of 45 slices shown (1 of 2)]
[im 3/45]
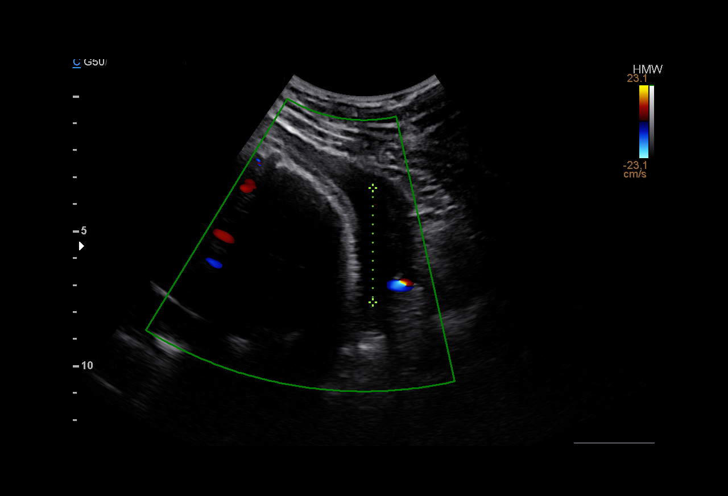
[im 7/45]
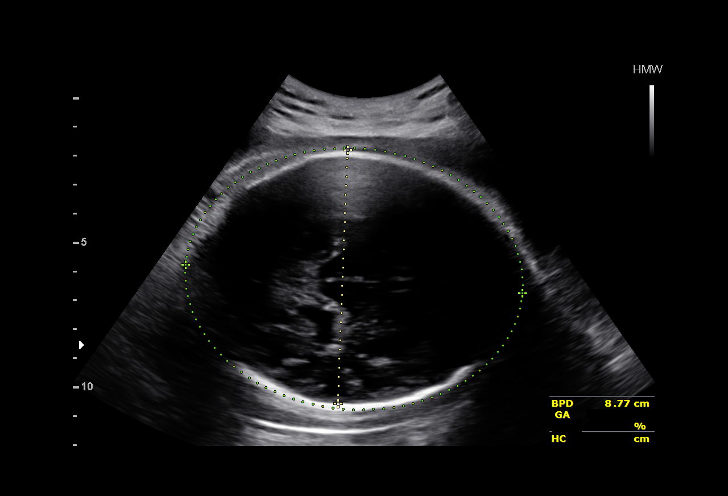
[im 12/45]
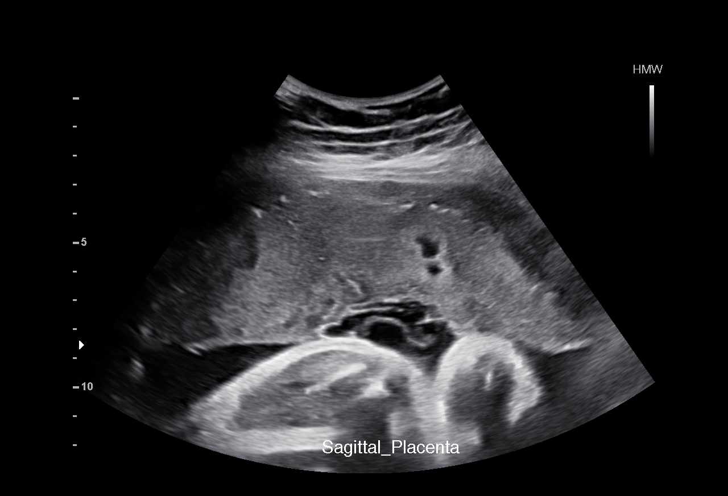
[im 16/45]
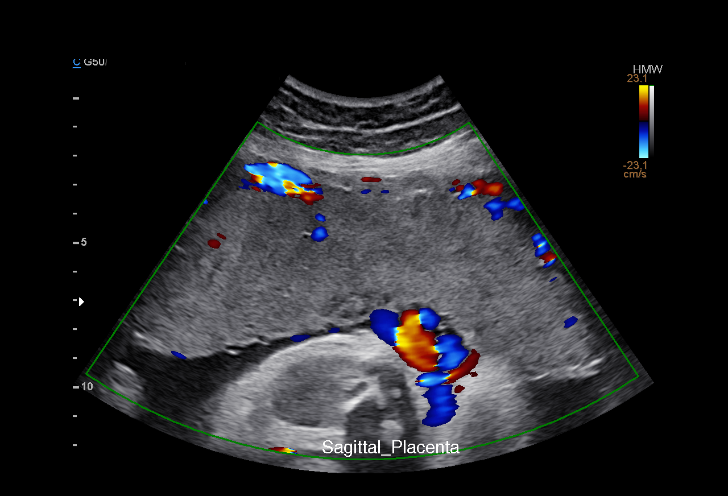
[im 20/45]
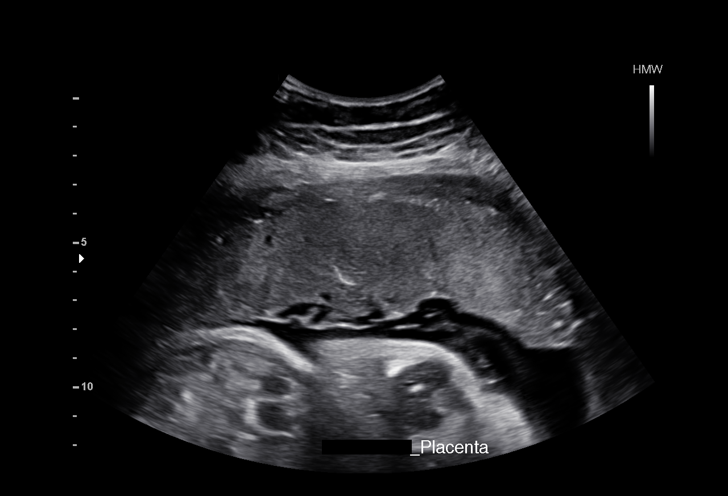
[im 25/45]
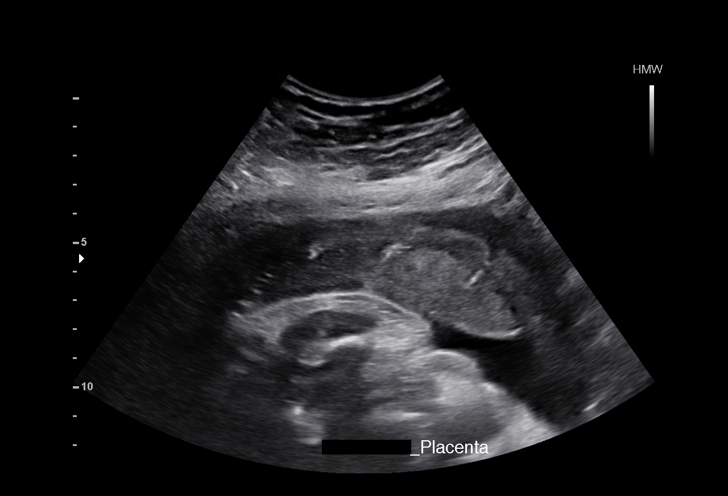
[im 29/45]
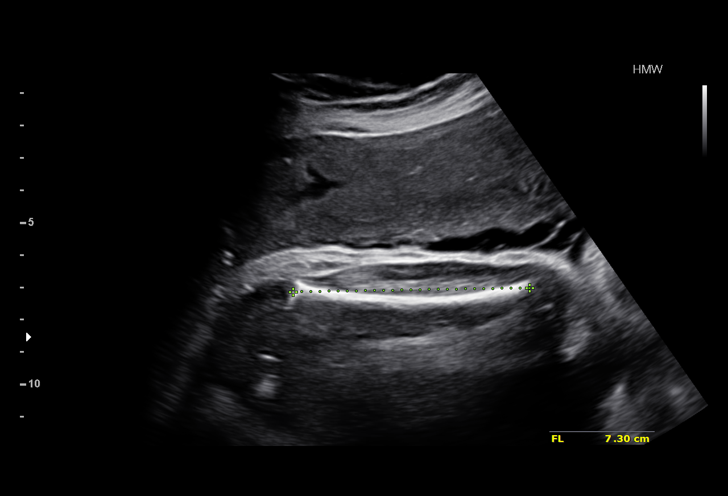
[im 34/45]
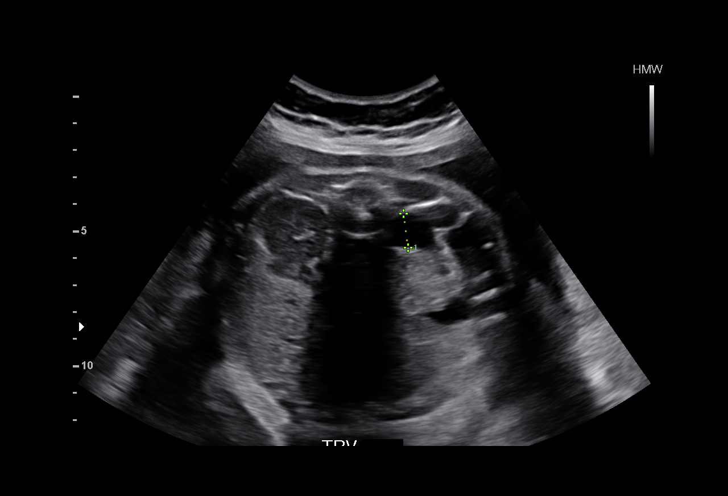
[im 38/45]
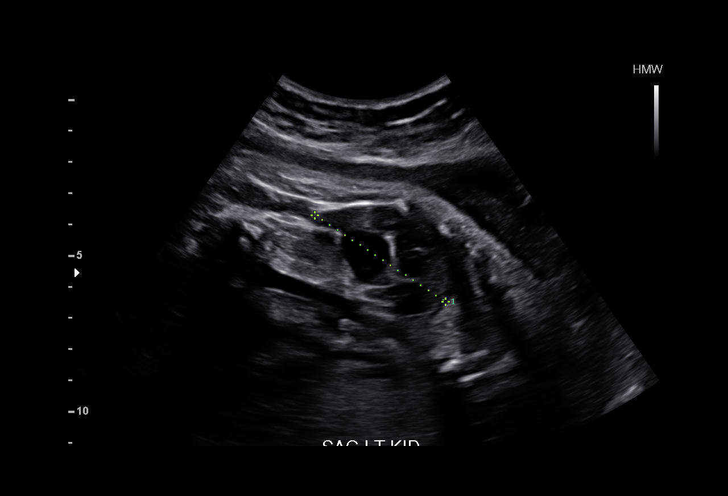
[im 42/45]
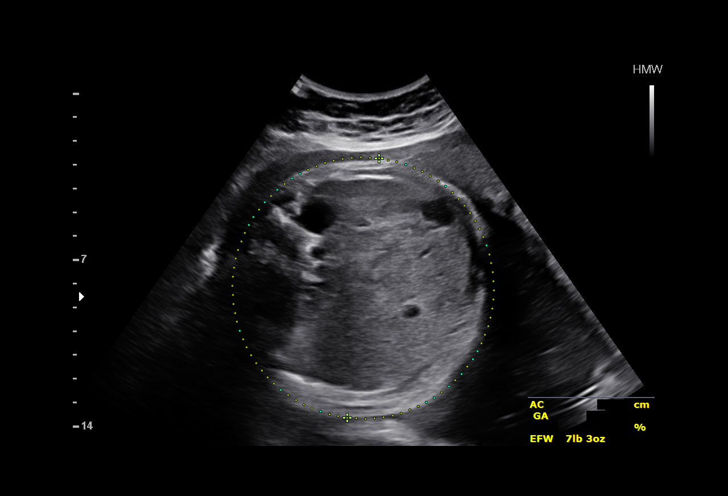

[Series 3: us mfm ob follow-up · 15 acquisitions, 4 frames shown (2 of 2)]
[im 1/15]
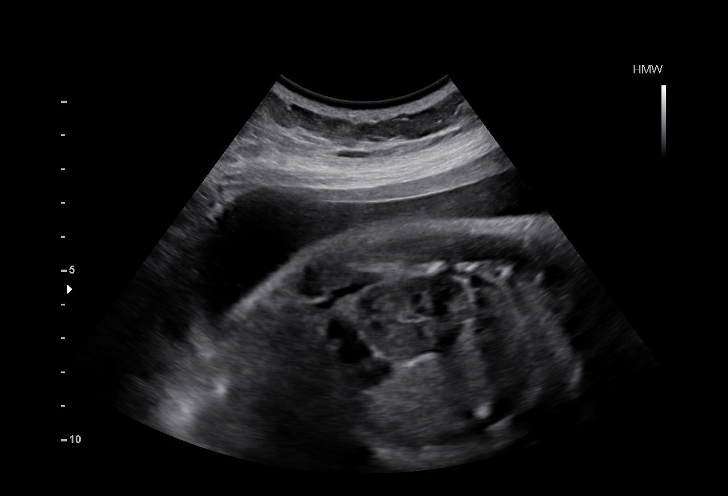
[im 5/15]
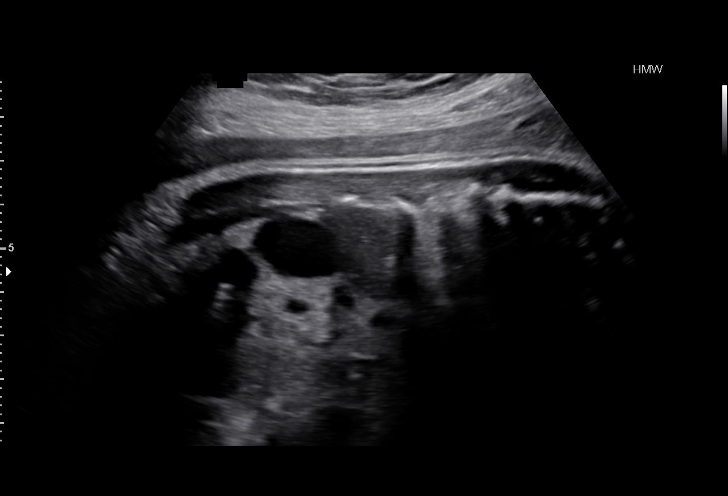
[im 10/15]
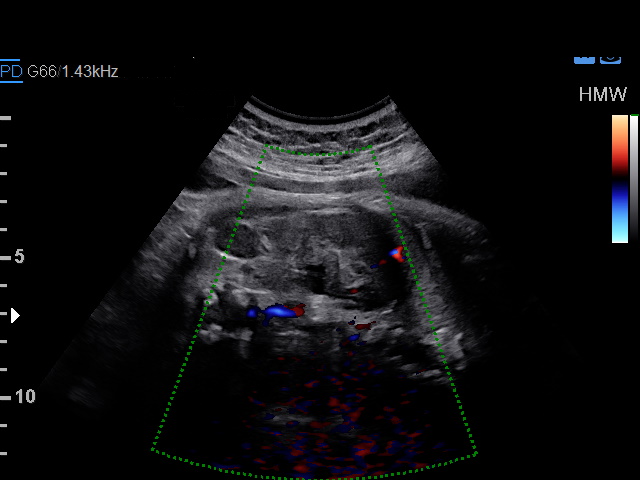
[im 15/15]
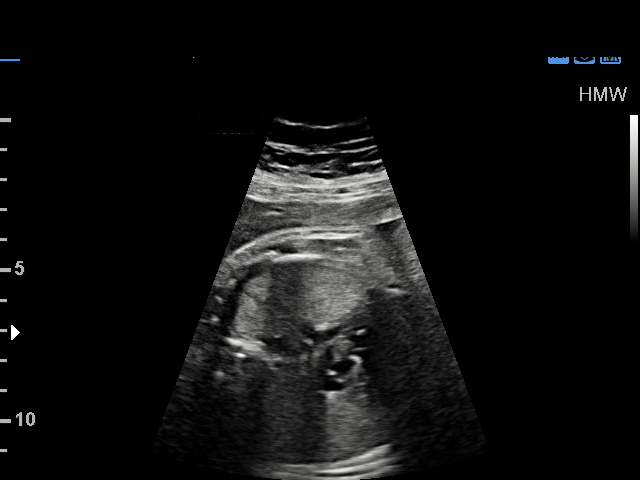

[14 of 28 positions shown; findings below may reference images not displayed]

OBSTETRICS REPORT
(Signed Final 11/22/2014 [DATE])

Date:

Faculty Physician
Service(s) Provided

Indications

36 weeks gestation of pregnancy
Pyelectasis of fetus on prenatal ultrasound
Hypertension - Chronic/Pre-existing (ASA)
Obesity complicating pregnancy, third trimester
Follow-up incomplete fetal anatomic evaluation         Z36
Fetal Evaluation

Num Of             1
Fetuses:
Fetal Heart        154                          bpm
Rate:
Cardiac Activity:  Observed
Presentation:      Cephalic
Placenta:          Anterior, above cervical
os
P. Cord            Visualized, central
Insertion:

Amniotic Fluid
AFI FV:      Subjectively within normal limits
AFI Sum:     18.73    cm      71  %Tile     Larg Pckt:    6.18   cm
RUQ:   6.18    cm    RLQ:   3.67    cm   LUQ:    4.24    cm   LLQ:    4.64   cm
Biometry

BPD:     87.2   m    G. Age:   35w 1d                 CI:        74.04   70 - 86
m
FL/HC:      22.7   20.8 -
22.6
HC:     321.8   m    G. Age:   36w 3d        19  %    HC/AC:      0.94   0.92 -
m
AC:     341.7   m    G. Age:   38w 0d        92  %    FL/BPD      83.7   71 - 87
m                                     :
FL:        73   m    G. Age:   37w 3d        67  %    FL/AC:      21.4   20 - 24
m
HUM:       65   m    G. Age:   37w 5d        89  %
m

Est.        7322   gm          7 lb     80   %
FW:
Gestational Age

LMP:           40w 1d        Date:  02/14/14                  EDD:   11/21/14
U/S Today:     36w 5d                                         EDD:   12/15/14
Best:          36w 4d    Det. By:   U/S (08/01/14)            EDD:   12/16/14
Anatomy

Cranium:          Appears normal         Aortic Arch:       Previously seen
Fetal Cavum:      Previously seen        Ductal Arch:       Previously seen
Ventricles:       Appears normal         Diaphragm:         Previously seen
Choroid Plexus:   Previously seen        Stomach:           Appears normal,
left sided
Cerebellum:       Previously seen        Abdomen:           Previously seen
Posterior         Previously seen        Abdominal          Previously seen
Fossa:                                   Wall:
Nuchal Fold:      Not applicable (>20    Cord Vessels:      Previously seen
wks GA)
Face:             Orbits and profile     Kidneys:           SEE COMMENTS
previously seen
Lips:             Previously seen        Bladder:           Appears normal
Heart:            Previously seen        Spine:             Previously seen
RVOT:             Previously seen        Lower              Previously seen
Extremities:
LVOT:             Previously seen        Upper              Previously seen
Extremities:

Other:   Fetus appears to be a female. Heels and 5th digits previously
visualized. Nasal bone previously visualized. Technically difficult
due to maternal habitus and fetal position.
Cervix Uterus Adnexa

Cervix:       Not visualized (advanced GA >84wks)
Uterus:       No abnormality visualized.
Cul De Sac:   No free fluid seen.

Left Ovary:    Not visualized.
Right Ovary:   Not visualized.

Adnexa:     No abnormality visualized.
Impression

SIUP at 36+4 weeks
Cystic structure measuring 1.5 x 2.1 cms in left renal fossa
which most likely represents renal pelvis; no normal renal
parenchyma identified; cyst surrounded by thin, echogenic
rim of tissue; ? small cystic, dysplastic kidney; normal
right kidney; normal bladder
All other interval fetal anatomy was seen and appeared
normal; anatomic survey complete
Normal amniotic fluid volume
Appropriate interval growth with EFW at the 80th %tile

The US findings were shared with Ms. Ollga. The
implications of the above findings were discussed in detail.
Recommendations

Routine prenatal care and delivery
Postnatal evaluation of newborn's kidneys

## 2017-11-12 ENCOUNTER — Ambulatory Visit: Payer: Self-pay | Admitting: Nurse Practitioner

## 2017-12-16 ENCOUNTER — Emergency Department (HOSPITAL_COMMUNITY): Payer: Self-pay

## 2017-12-16 ENCOUNTER — Emergency Department (HOSPITAL_COMMUNITY)
Admission: EM | Admit: 2017-12-16 | Discharge: 2017-12-16 | Disposition: A | Discharge status: Home / Self Care | Payer: Self-pay | Attending: Emergency Medicine | Admitting: Emergency Medicine

## 2017-12-16 ENCOUNTER — Encounter (HOSPITAL_COMMUNITY): Payer: Self-pay | Admitting: Emergency Medicine

## 2017-12-16 DIAGNOSIS — F17210 Nicotine dependence, cigarettes, uncomplicated: Secondary | ICD-10-CM | POA: Insufficient documentation

## 2017-12-16 DIAGNOSIS — J189 Pneumonia, unspecified organism: Secondary | ICD-10-CM

## 2017-12-16 DIAGNOSIS — J181 Lobar pneumonia, unspecified organism: Secondary | ICD-10-CM | POA: Insufficient documentation

## 2017-12-16 DIAGNOSIS — I10 Essential (primary) hypertension: Secondary | ICD-10-CM | POA: Insufficient documentation

## 2017-12-16 LAB — GROUP A STREP BY PCR: Group A Strep by PCR: NOT DETECTED

## 2017-12-16 LAB — POC URINE PREG, ED: Preg Test, Ur: NEGATIVE

## 2017-12-16 MED ORDER — AZITHROMYCIN 250 MG PO TABS: 250.0000 mg | ORAL_TABLET | Freq: Every day | ORAL | 0 refills | Status: DC

## 2017-12-16 MED ORDER — ACETAMINOPHEN 325 MG PO TABS
650.0000 mg | ORAL_TABLET | Freq: Once | ORAL | Status: AC
  Administered 2017-12-16: 650 mg via ORAL
  Filled 2017-12-16: qty 2

## 2017-12-16 MED ORDER — AZITHROMYCIN 250 MG PO TABS
500.0000 mg | ORAL_TABLET | Freq: Once | ORAL | Status: AC
  Administered 2017-12-16: 500 mg via ORAL
  Filled 2017-12-16: qty 2

## 2017-12-16 NOTE — ED Triage Notes (Signed)
Pt reports cough is productive since yesterday, sore throat for couple days. Had sick co-workers.

## 2017-12-16 NOTE — ED Notes (Signed)
Pt reports that she doesn't take medications for her HTN

## 2017-12-16 NOTE — Discharge Instructions (Signed)
You have been diagnosed today with community-acquired pneumonia.  At this time there does not appear to be the presence of an emergent medical condition, however there is always the potential for conditions to worsen. Please read and follow the below instructions.  Please return to the Emergency Department immediately for any new or worsening symptoms or if your symptoms do not improve. Please be sure to follow up with your Primary Care Provider as soon as possible regarding your visit today; please call their office to schedule an appointment even if you are feeling better for a follow-up visit. You have been given your first dose of antibiotic of azithromycin today.  Please take the rest of your azithromycin starting tomorrow, 1 pill daily for 4 days.  Contact a health care provider if: You have a fever. You are losing sleep because you cannot control your cough with cough medicine. Get help right away if: You have worsening shortness of breath. You have increased chest pain. Your sickness becomes worse, especially if you are an older adult or have a weakened immune system. You cough up blood.  Please read the additional information packets attached to your discharge summary.  Do not take your medicine if  develop an itchy rash, swelling in your mouth or lips, or difficulty breathing.

## 2017-12-16 NOTE — ED Provider Notes (Signed)
[facility]-EMERGENCY DEPT Provider Note   CSN: [id number] Arrival date & time: 12/16/17  1737     History   Chief Complaint Chief Complaint  Patient presents with  . Cough  . Sore Throat    HPI Alicia Simon is a 22 y.o. female presenting today for sore throat, cough and fever that began 2 days ago.  Patient states that she has had multiple coworkers were sick with a similar illness.  Patient states that her sore throat is a mild burning pain on both sides of her throat that is constant and worsened with swallowing.  Patient has not taken medication for this pain but states it has felt slightly improved today.  Patient states that her cough began yesterday, states that it is a constant hacking cough productive with yellow sputum.  Patient states that her cough has become painful, describes a burning pain in her throat and center of her chest with cough.  Patient denies chest pain or shortness of breath.  Patient states that the pain feels like it is coming from her rib cage and is due to her cough.  Patient also endorses fever of 100.1 yesterday, has not measured a temperature today.  Patient states she not taken anything for her fever.  Patient states that she is an otherwise healthy individual who does not take medications on a daily basis.  HPI  Past Medical History:  Diagnosis Date  . Hypertension     There are no active problems to display for this patient.   Past Surgical History:  Procedure Laterality Date  . WISDOM TOOTH EXTRACTION       OB History    Gravida  2   Para  2   Term  2   Preterm  0   AB  0   Living  2     SAB  0   TAB  0   Ectopic  0   Multiple  0   Live Births  2            Home Medications    Prior to Admission medications   Medication Sig Start Date End Date Taking? Authorizing Provider  acetaminophen (TYLENOL) 500 MG tablet Take 500 mg by mouth every 6 (six) hours as needed for mild pain.      [provider]  azithromycin (ZITHROMAX) 250 MG tablet Take 1 tablet (250 mg total) by mouth daily. You have been given your first dose of the azithromycin today in the hospital.  Please take 1 pill daily for 4 days starting tomorrow. 12/17/17   Harlene Salts A, PA-C  ciprofloxacin (CIPRO) 500 MG tablet Take 1 tablet (500 mg total) by mouth 2 (two) times daily. Patient not taking: Reported on 04/14/2016 03/04/16   Katrinka Blazing [place], CNM  HYDROcodone-acetaminophen (NORCO) 5-325 MG tablet Take 1 tablet by mouth every 4 (four) hours as needed. 04/14/16   Mancel Bale, MD  ibuprofen (ADVIL,MOTRIN) 600 MG tablet Take 1 tablet (600 mg total) by mouth every 6 (six) hours as needed. 02/27/16   Katrinka Blazing, [place], CNM  naproxen sodium (ANAPROX) 220 MG tablet Take 220 mg by mouth 2 (two) times daily as needed (pain, headache).    [provider]  phenazopyridine (PYRIDIUM) 200 MG tablet TAKE 1 TABLET(200 MG) BY MOUTH THREE TIMES DAILY AS NEEDED FOR PAIN Patient not taking: Reported on 04/14/2016 02/27/16   [name], CNM    Family History No family history on file.  Social History  Social History   Tobacco Use  . Smoking status: Current Every Day Smoker    Packs/day: 0.25    Years: 0.50    Pack years: 0.12    Types: Cigarettes  . Smokeless tobacco: Never Used  Substance Use Topics  . Alcohol use: No  . Drug use: No     Allergies   Patient has no known allergies.   Review of Systems Review of Systems  Constitutional: Positive for chills and fever. Negative for diaphoresis.  HENT: Positive for congestion, rhinorrhea and sore throat. Negative for drooling, ear pain, facial swelling, trouble swallowing and voice change.   Respiratory: Positive for cough. Negative for shortness of breath.   Cardiovascular: Negative for chest pain and leg swelling.  Gastrointestinal: Negative.  Negative for abdominal pain, diarrhea, nausea and vomiting.  Genitourinary: Negative.  Negative for  difficulty urinating and dysuria.  Musculoskeletal: Positive for arthralgias and myalgias.  Skin: Negative.  Negative for rash.  Neurological: Negative.  Negative for dizziness, weakness and headaches.    Physical Exam Updated Vital Signs BP 134/86 (BP Location: Right Arm)   Pulse 98   Temp 99.3 F (37.4 C) (Oral)   Resp 20   Ht 5' 3.5" (1.613 m)   Wt 127.5 kg   LMP 12/12/2017   SpO2 98%   BMI 49.00 kg/m   Physical Exam  Constitutional: She appears well-developed and well-nourished.  Non-toxic appearance. She does not appear ill. No distress.  HENT:  Head: Normocephalic and atraumatic.  Right Ear: Hearing, tympanic membrane, external ear and ear canal normal.  Left Ear: Hearing, tympanic membrane, external ear and ear canal normal.  Nose: Nose normal.  Mouth/Throat: Uvula is midline, oropharynx is clear and moist and mucous membranes are normal. No uvula swelling. No oropharyngeal exudate, posterior oropharyngeal edema, posterior oropharyngeal erythema or tonsillar abscesses. Tonsils are 1+ on the right. Tonsils are 1+ on the left. No tonsillar exudate.  The patient has normal phonation and is in control of secretions. No stridor.  Midline uvula without edema. Soft palate rises symmetrically. No tonsillar erythema, swelling or exudates. Tongue protrusion is normal, floor of mouth is soft. No trismus. No creptius on neck palpation. No gingival erythema or fluctuance noted. Mucus membranes moist. No pallor noted.  Patient with mild cobblestoning of posterior oropharynx consistent with postnasal drip.  Eyes: Pupils are equal, round, and reactive to light. Conjunctivae and EOM are normal.  Neck: Trachea normal, normal range of motion, full passive range of motion without pain and phonation normal. Neck supple. No tracheal tenderness present. No neck rigidity. No tracheal deviation, no edema and no erythema present.  Cardiovascular: Normal rate, regular rhythm and normal heart sounds.    Pulses:      Dorsalis pedis pulses are 2+ on the right side, and 2+ on the left side.       Posterior tibial pulses are 2+ on the right side, and 2+ on the left side.  Pulmonary/Chest: Effort normal and breath sounds normal. No respiratory distress. She exhibits tenderness. She exhibits no crepitus and no deformity.  Patient with mild tenderness to palpation over the rib cage.  Abdominal: Soft. Bowel sounds are normal. There is no tenderness. There is no rigidity, no rebound and no guarding.  Musculoskeletal: Normal range of motion.       Right lower leg: Normal.       Left lower leg: Normal.  Feet:  Right Foot:  Protective Sensation: 3 sites tested. 3 sites sensed.  Left Foot:  Protective Sensation: 3 sites tested. 3 sites sensed.  Neurological: She is alert. GCS eye subscore is 4. GCS verbal subscore is 5. GCS motor subscore is 6.  Speech is clear and goal oriented, follows commands Major Cranial nerves without deficit, no facial droop Normal strength in upper and lower extremities bilaterally including dorsiflexion and plantar flexion, strong and equal grip strength Sensation normal to light touch Moves extremities without ataxia, coordination intact Normal gait  Skin: Skin is warm and dry. Capillary refill takes less than 2 seconds.  Psychiatric: She has a normal mood and affect. Her behavior is normal.    ED Treatments / Results  Labs (all labs ordered are listed, but only abnormal results are displayed) Labs Reviewed  GROUP A STREP BY PCR  POC URINE PREG, ED    EKG None  Radiology Dg Chest 2 View  Result Date: 12/16/2017 CLINICAL DATA:  Productive cough since yesterday.  Sore throat. EXAM: CHEST - 2 VIEW COMPARISON:  01/04/2005 FINDINGS: Heart size is normal. Mediastinal shadows are normal. There is mild patchy density in the right middle lobe, less than was seen in 2006. This could represent a right middle lobe pneumonia or could be chronic scarring related to the  previous pneumonia. The remainder of the lungs are clear. No effusions. IMPRESSION: Mild patchy density in the right middle lobe that could represent mild right middle lobe pneumonia. Alternatively, this could be some residual scarring from a previous episode of right middle lobe pneumonia in 2006. Electronically Signed   By: Paulina Fusi M.D.   On: 12/16/2017 20:36    Procedures Procedures (including critical care time)  Medications Ordered in ED Medications  azithromycin (ZITHROMAX) tablet 500 mg (has no administration in time range)  acetaminophen (TYLENOL) tablet 650 mg (650 mg Oral Given 12/16/17 1838)     Initial Impression / Assessment and Plan / ED Course  I have reviewed the triage vital signs and the nursing notes.  Pertinent labs & imaging results that were available during my care of the patient were reviewed by me and considered in my medical decision making (see chart for details).     Urine pregnant negative Group A strep negative Chest x-ray with possible right middle lobe pneumonia  Patient with URI symptoms and increased cough over the past few days.  Patient has been diagnosed with CAP via chest xray. Pt is not ill appearing, immunocompromised, and does not have multiple co morbidities, therefore I feel like the they can be treated as an OP with abx therapy. Pt has been advised to return to the ED if symptoms worsen or they do not improve. Pt verbalizes understanding and is agreeable with plan.  Additionally patient mentions burning chest pain only with coughing that she attributes to her cough.  Patient with tenderness to chest wall on palpation and no pain in between coughing spells.  Do not suspect ACS/PE or other cardiovascular etiologies of pain; at this time I suspect musculoskeletal cause of pain associated with her community acquired pneumonia today.  Discussed with patient who agrees and states that she does not wish to have further work-up at this  point.  Patient given Tylenol today for her symptoms.  Patient states improvement of all of her symptoms following single Tylenol dose.  She is afebrile, not tachycardic, not tachypneic and SPO2 of 98% on room air at discharge.  Patient informed that she may continue to use Tylenol as directed on the packaging for her symptoms.  Patient given first  dose of azithromycin here today.  Informed to take 250 mg of azithromycin daily for the next 4 days starting tomorrow.  Patient states understanding.  At this time there does not appear to be any evidence of an acute emergency medical condition and the patient appears stable for discharge with appropriate outpatient follow up. Diagnosis was discussed with patient who verbalizes understanding of care plan and is agreeable to discharge. I have discussed return precautions with patient who verbalizes understanding of return precautions. Patient strongly encouraged to follow-up with their PCP. All questions answered.  Patient's case discussed with Dr. Juleen China who agrees with plan to discharge with follow-up.     Note: Portions of this report may have been transcribed using voice recognition software. Every effort was made to ensure accuracy; however, inadvertent computerized transcription errors may still be present. Final Clinical Impressions(s) / ED Diagnoses   Final diagnoses:  Community acquired pneumonia of right middle lobe of lung [facility])    ED Discharge Orders         Ordered    azithromycin (ZITHROMAX) 250 MG tablet  Daily     12/16/17 2158           Elizabeth Palau 12/16/17 2204    Raeford Razor, MD 12/18/17 1005

## 2018-11-01 ENCOUNTER — Other Ambulatory Visit: Payer: Self-pay

## 2018-11-01 ENCOUNTER — Emergency Department (HOSPITAL_COMMUNITY)
Admission: EM | Admit: 2018-11-01 | Discharge: 2018-11-01 | Discharge status: Against Medical Advice | Payer: Medicaid Other

## 2018-11-03 ENCOUNTER — Encounter (HOSPITAL_COMMUNITY): Payer: Self-pay

## 2018-11-03 ENCOUNTER — Other Ambulatory Visit: Payer: Self-pay

## 2018-11-03 ENCOUNTER — Emergency Department (HOSPITAL_COMMUNITY): Payer: Medicaid Other

## 2018-11-03 ENCOUNTER — Emergency Department (HOSPITAL_COMMUNITY)
Admission: EM | Admit: 2018-11-03 | Discharge: 2018-11-03 | Disposition: A | Discharge status: Home / Self Care | Payer: Medicaid Other | Attending: Emergency Medicine | Admitting: Emergency Medicine

## 2018-11-03 DIAGNOSIS — Y9259 Other trade areas as the place of occurrence of the external cause: Secondary | ICD-10-CM | POA: Insufficient documentation

## 2018-11-03 DIAGNOSIS — S0083XA Contusion of other part of head, initial encounter: Secondary | ICD-10-CM

## 2018-11-03 DIAGNOSIS — M545 Low back pain, unspecified: Secondary | ICD-10-CM

## 2018-11-03 DIAGNOSIS — Y93E2 Activity, laundry: Secondary | ICD-10-CM | POA: Diagnosis not present

## 2018-11-03 DIAGNOSIS — I10 Essential (primary) hypertension: Secondary | ICD-10-CM | POA: Insufficient documentation

## 2018-11-03 DIAGNOSIS — W010XXA Fall on same level from slipping, tripping and stumbling without subsequent striking against object, initial encounter: Secondary | ICD-10-CM | POA: Insufficient documentation

## 2018-11-03 DIAGNOSIS — Y999 Unspecified external cause status: Secondary | ICD-10-CM | POA: Diagnosis not present

## 2018-11-03 DIAGNOSIS — S0990XA Unspecified injury of head, initial encounter: Secondary | ICD-10-CM | POA: Diagnosis present

## 2018-11-03 LAB — POC URINE PREG, ED: Preg Test, Ur: NEGATIVE

## 2018-11-03 MED ORDER — CYCLOBENZAPRINE HCL 10 MG PO TABS: 10.0000 mg | ORAL_TABLET | Freq: Two times a day (BID) | ORAL | 0 refills | Status: AC | PRN

## 2018-11-03 NOTE — ED Triage Notes (Addendum)
Pt states that she was at the [facility] on Sunday, when she slipped and hit her head near her left eyebrow. Pt states that she is having head pain and lower back pain. Pt states she had some relief with aleve, last taken at 1200. No LOC.

## 2018-11-03 NOTE — ED Provider Notes (Signed)
[facility] DEPT Provider Note   CSN: [id number] Arrival date & time: 11/03/18  1336     History   Chief Complaint Chief Complaint  Patient presents with  . Fall    HPI Alicia Simon is a 23 y.o. female past medical history of hypertension, presenting to the emergency department with complaint of mechanical fall that occurred on Sunday.  She states she was at a laundromat and accidentally slipped on some water, falling down twisting her low back though somewhat hitting it on the wash machine door as well as striking her left brow on the wash machine door.  No LOC.  She states that she felt fine that evening, however yesterday has more pain to her left brow and low back.  Has treated symptoms with Tylenol and Aleve.  She has some mild localized headache to the left head, no associated vision changes, nausea, vomiting, neck pain, balance issues.  She endorses bilateral low back pain that is somewhat worse on the right side.  No new numbness or weakness in extremities, bowel or bladder incontinence, saddle paresthesias.  She is able to walk without difficulty.  Not on blood thinners.     The history is provided by the patient.    Past Medical History:  Diagnosis Date  . Hypertension     There are no active problems to display for this patient.   Past Surgical History:  Procedure Laterality Date  . WISDOM TOOTH EXTRACTION       OB History    Gravida  2   Para  2   Term  2   Preterm  0   AB  0   Living  2     SAB  0   TAB  0   Ectopic  0   Multiple  0   Live Births  2            Home Medications    Prior to Admission medications   Medication Sig Start Date End Date Taking? Authorizing Provider  naproxen sodium (ANAPROX) 220 MG tablet Take 220 mg by mouth 2 (two) times daily as needed (pain, headache).   Yes [provider]  azithromycin (ZITHROMAX) 250 MG tablet Take 1 tablet (250 mg total) by mouth daily. You  have been given your first dose of the azithromycin today in the hospital.  Please take 1 pill daily for 4 days starting tomorrow. Patient not taking: Reported on 11/03/2018 12/17/17   Nuala Alpha A, PA-C  ciprofloxacin (CIPRO) 500 MG tablet Take 1 tablet (500 mg total) by mouth 2 (two) times daily. Patient not taking: Reported on 04/14/2016 03/04/16   Tamala Julian, [place], CNM  cyclobenzaprine (FLEXERIL) 10 MG tablet Take 1 tablet (10 mg total) by mouth 2 (two) times daily as needed for muscle spasms. 11/03/18   , [place] N, PA-C  HYDROcodone-acetaminophen (NORCO) 5-325 MG tablet Take 1 tablet by mouth every 4 (four) hours as needed. Patient not taking: Reported on 11/03/2018 04/14/16   Daleen Bo, MD  ibuprofen (ADVIL,MOTRIN) 600 MG tablet Take 1 tablet (600 mg total) by mouth every 6 (six) hours as needed. Patient not taking: Reported on 11/03/2018 02/27/16   Tamala Julian, [place], CNM  phenazopyridine (PYRIDIUM) 200 MG tablet TAKE 1 TABLET(200 MG) BY MOUTH THREE TIMES DAILY AS NEEDED FOR PAIN Patient not taking: Reported on 04/14/2016 02/27/16   Manya Silvas, CNM    Family History No family history on file.  Social History Social History  Tobacco Use  . Smoking status: Current Every Day Smoker    Packs/day: 0.25    Years: 0.50    Pack years: 0.12    Types: Cigarettes  . Smokeless tobacco: Never Used  Substance Use Topics  . Alcohol use: No  . Drug use: No     Allergies   Patient has no known allergies.   Review of Systems Review of Systems  Musculoskeletal: Positive for back pain.  Neurological: Positive for headaches. Negative for syncope, weakness and numbness.  Hematological: Does not bruise/bleed easily.  Psychiatric/Behavioral: Negative for confusion.     Physical Exam Updated Vital Signs BP (!) 140/98 (BP Location: Right Arm)   Pulse 98   Temp 98.6 F (37 C) (Oral)   Resp 16   Ht 5\' 3"  (1.6 m)   Wt 117.9 kg   SpO2 100%   BMI 46.06 kg/m   Physical Exam  Vitals signs and nursing note reviewed.  Constitutional:      General: She is not in acute distress.    Appearance: She is well-developed.  HENT:     Head: Normocephalic.     Comments: Small healing bruise to the lateral aspect of the left upper lid.  There is no swelling appreciated.  There is some bony tenderness to the left brow, no wounds or deformity. No subconjunctival hemorrhage. Eyes:     Conjunctiva/sclera: Conjunctivae normal.  Neck:     Musculoskeletal: Normal range of motion and neck supple. No muscular tenderness.  Cardiovascular:     Rate and Rhythm: Normal rate and regular rhythm.  Pulmonary:     Effort: Pulmonary effort is normal. No respiratory distress.     Breath sounds: Normal breath sounds.  Musculoskeletal:     Comments: Generalized L-spine and paraspinal tenderness, no any step-offs or gross deformities.  Moving all extremities without difficulty.  Neurological:     Mental Status: She is alert.     Comments: Mental Status:  Alert, oriented, thought content appropriate, able to give a coherent history. Speech fluent without evidence of aphasia. Able to follow 2 step commands without difficulty.  Cranial Nerves:  II:  Peripheral visual fields grossly normal, pupils equal, round, reactive to light III,IV, VI: ptosis not present, extra-ocular motions intact bilaterally  V,VII: smile symmetric, facial light touch sensation equal VIII: hearing grossly normal to voice  X: uvula elevates symmetrically  XI: bilateral shoulder shrug symmetric and strong XII: midline tongue extension without fassiculations Motor:  Normal tone. 5/5 in upper and lower extremities bilaterally including strong and equal grip strength and dorsiflexion/plantar flexion Sensory: grossly normal in all extremities.  Cerebellar: normal finger-to-nose with bilateral upper extremities Gait: normal gait and balance CV: distal pulses palpable throughout    Psychiatric:        Mood and Affect:  Mood normal.        Behavior: Behavior normal.      ED Treatments / Results  Labs (all labs ordered are listed, but only abnormal results are displayed) Labs Reviewed  POC URINE PREG, ED    EKG None  Radiology Dg Lumbar Spine Complete  Result Date: 11/03/2018 CLINICAL DATA:  Pain following fall EXAM: LUMBAR SPINE - COMPLETE 4+ VIEW COMPARISON:  None. FINDINGS: Frontal, lateral, spot lumbosacral lateral, and bilateral oblique views were obtained. There are 5 non-rib-bearing lumbar type vertebral bodies. There is no fracture or spondylolisthesis. The disc spaces appear unremarkable. There is no appreciable facet arthropathy. IMPRESSION: No fracture or spondylolisthesis.  No evident arthropathy. Electronically  Signed   By: Bretta Bang III M.D.   On: 11/03/2018 18:01    Procedures Procedures (including critical care time)  Medications Ordered in ED Medications - No data to display   Initial Impression / Assessment and Plan / ED Course  I have reviewed the triage vital signs and the nursing notes.  Pertinent labs & imaging results that were available during my care of the patient were reviewed by me and considered in my medical decision making (see chart for details).       Patient presenting with left eyebrow pain and localized headache as well as low back pain after mechanical fall 2 days ago.  Patient has had intermittent headache though no concerning symptoms for closed head injury.  Low suspicion for facial bone fractures on exam.  No neuro deficits.  X-ray of the L-spine is negative.  Will discuss symptomatic management with concussion precautions and outpatient follow-up.  Patient is not on anticoagulation.  She is well-appearing and in no distress.  Patient agreeable to plan and safe for discharge.  Discussed results, findings, treatment and follow up. Patient advised of return precautions. Patient verbalized understanding and agreed with plan.  Final Clinical  Impressions(s) / ED Diagnoses   Final diagnoses:  Contusion of face, initial encounter  Acute bilateral low back pain without sciatica    ED Discharge Orders         Ordered    cyclobenzaprine (FLEXERIL) 10 MG tablet  2 times daily PRN     11/03/18 1809           , [place] N, PA-C 11/03/18 1809    Alvira Monday, MD 11/05/18 [phone]

## 2018-11-03 NOTE — Discharge Instructions (Signed)
Your xray is normal. Please read instructions below. Stay hydrated.  Limit your complex thinking and screen time until your headaches resolved.  Avoid contact sports and activities. Apply ice to your areas of pain for 20 minutes at a time. You can take 600 mg of Advil/ibuprofen every 6 hours as needed for pain. You can take flexeril every 12 hours as needed for muscle spasm. Be aware this medication can make you drowsy. Schedule an appointment with your primary care provider to follow up on your visit today. Return to the ER for severely worsening headache, vision changes, if new numbness or tingling in your arms or legs, inability to urinate, inability to hold your bowels, or weakness in your extremities.

## 2019-11-19 IMAGING — CR DG CHEST 2V
2 series · 2 of 2 positions shown · non-contrast
Comparison: 01/04/2005

CLINICAL DATA: Productive cough since yesterday.  Sore throat.

EXAM:
CHEST - 2 VIEW

[w chest pa]
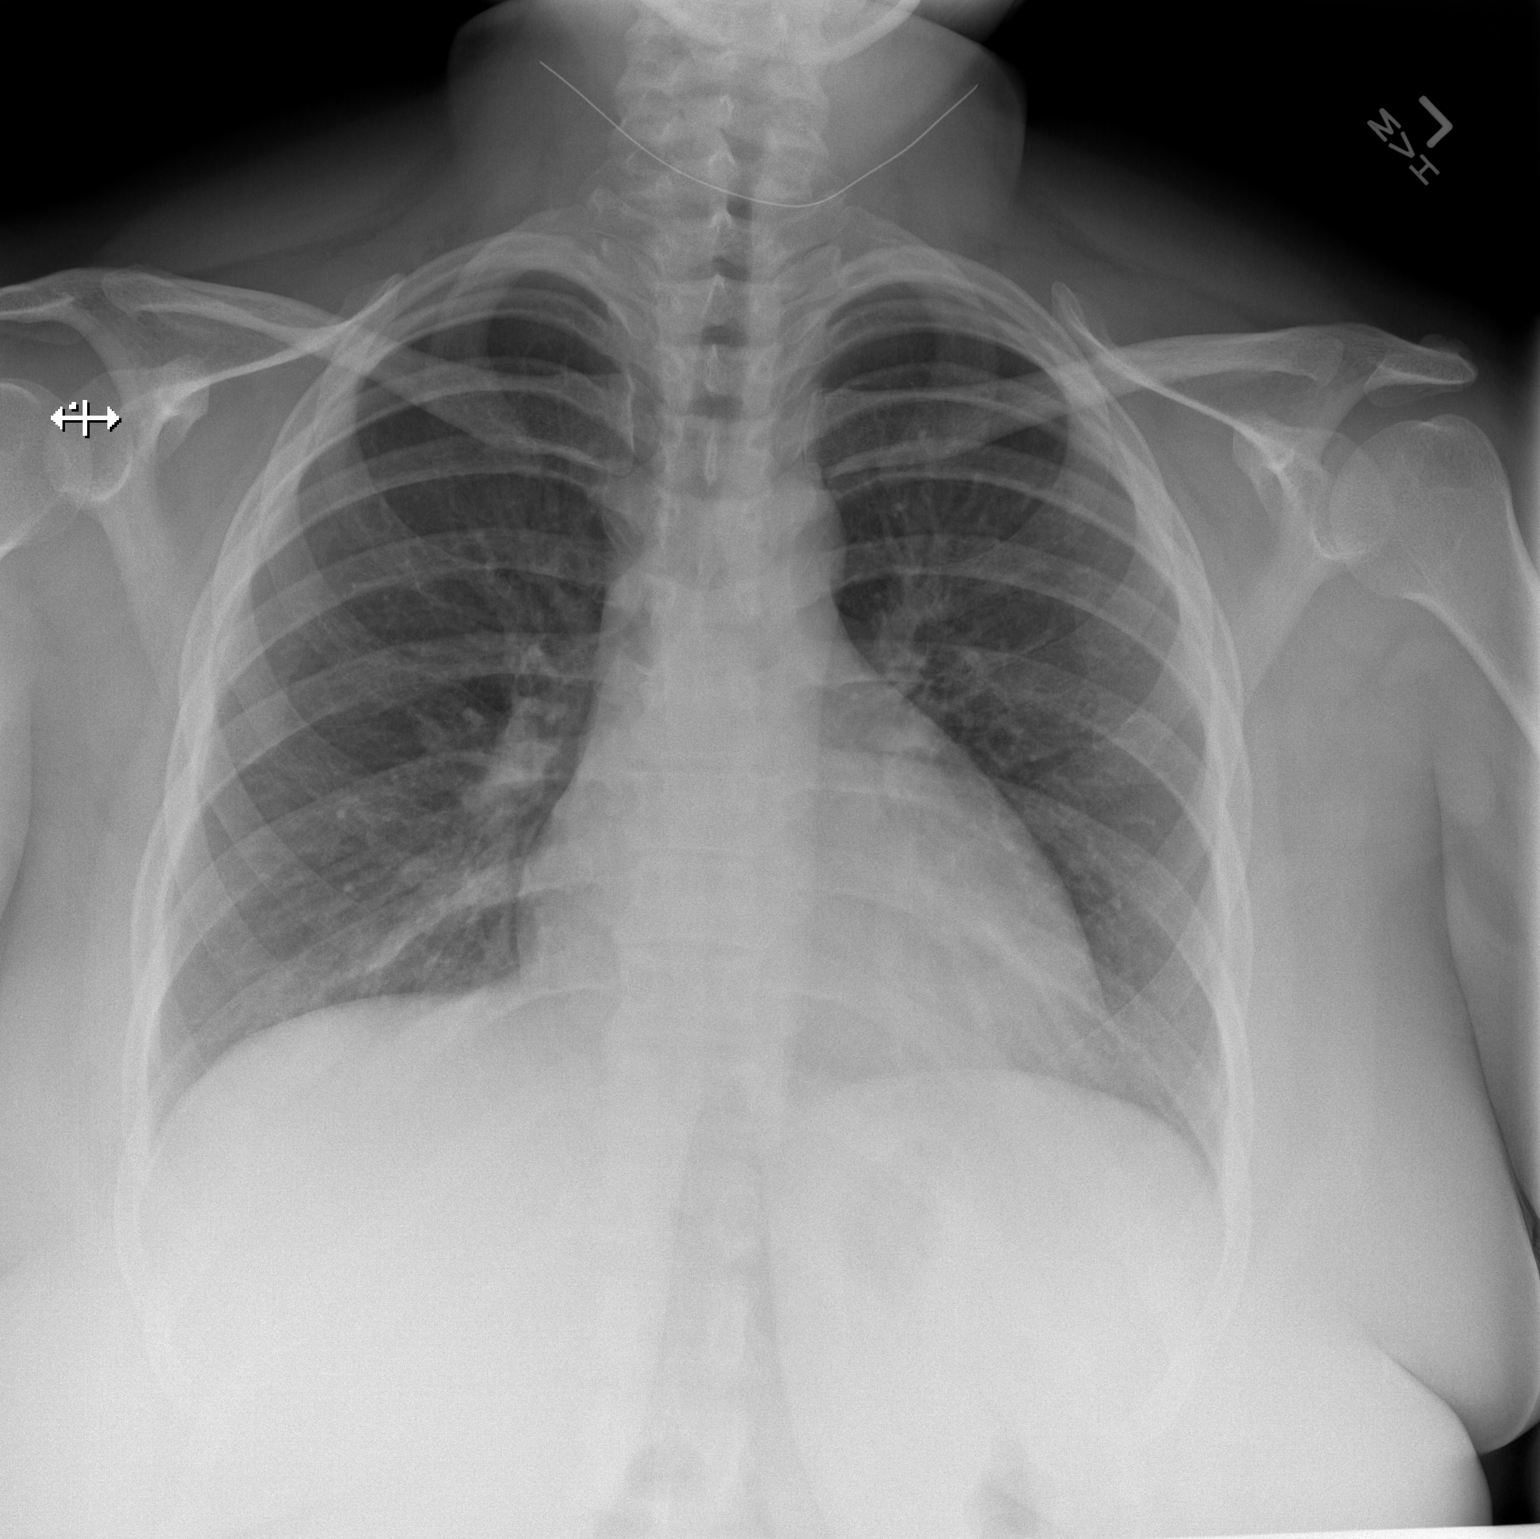

[w chest lat]
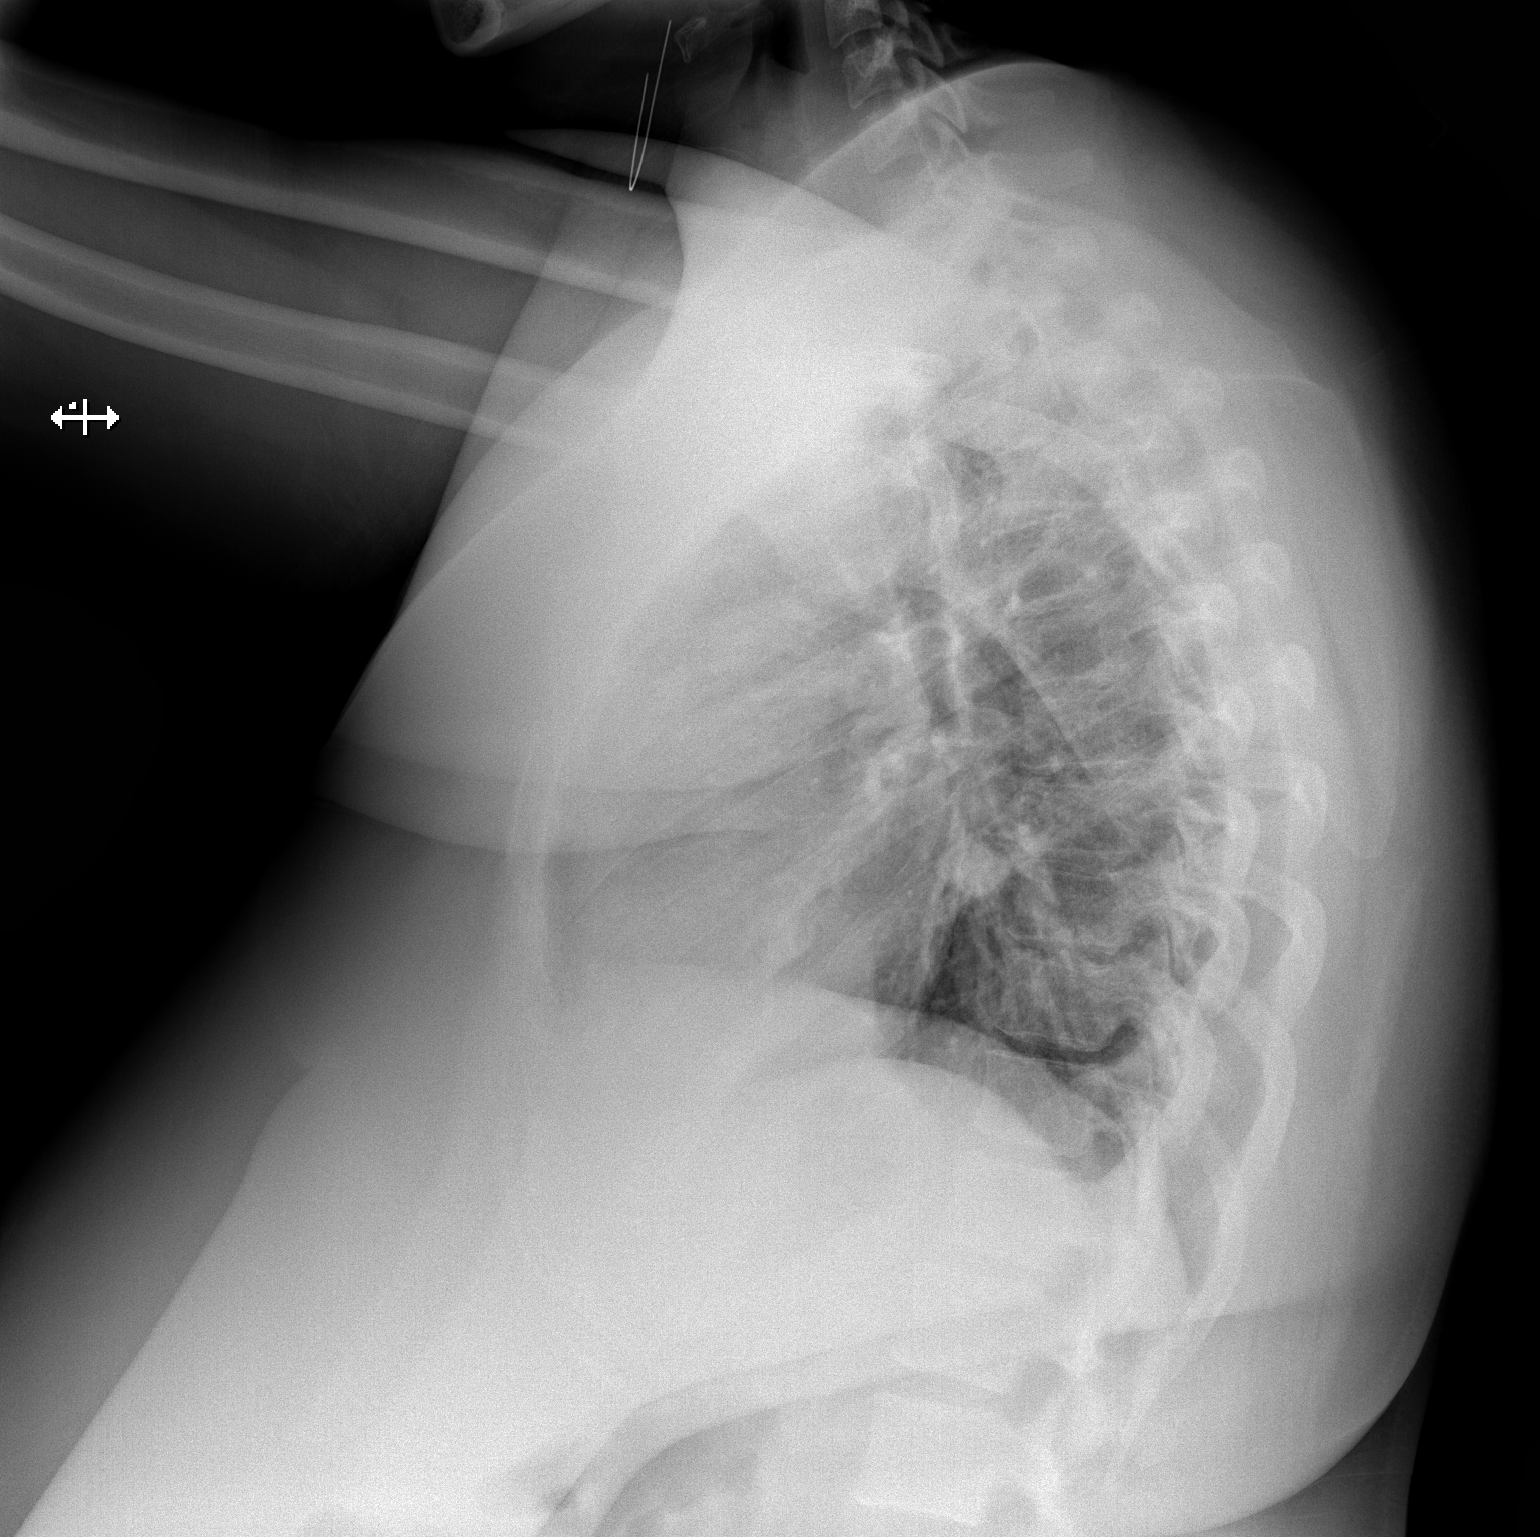

[2 of 2 positions shown; findings below may reference images not displayed]

FINDINGS: Heart size is normal. Mediastinal shadows are normal. There is mild
patchy density in the right middle lobe, less than was seen in 3113.
This could represent a right middle lobe pneumonia or could be
chronic scarring related to the previous pneumonia. The remainder of
the lungs are clear. No effusions.
IMPRESSION: Mild patchy density in the right middle lobe that could represent
mild right middle lobe pneumonia. Alternatively, this could be some
residual scarring from a previous episode of right middle lobe
pneumonia in 3113.

## 2020-03-27 ENCOUNTER — Encounter (HOSPITAL_BASED_OUTPATIENT_CLINIC_OR_DEPARTMENT_OTHER): Payer: Self-pay

## 2020-03-27 ENCOUNTER — Emergency Department (HOSPITAL_BASED_OUTPATIENT_CLINIC_OR_DEPARTMENT_OTHER)
Admission: EM | Admit: 2020-03-27 | Discharge: 2020-03-27 | Disposition: A | Discharge status: Home / Self Care | Payer: Medicaid Other | Source: Home / Self Care | Attending: Emergency Medicine | Admitting: Emergency Medicine

## 2020-03-27 ENCOUNTER — Other Ambulatory Visit: Payer: Self-pay

## 2020-03-27 ENCOUNTER — Emergency Department (HOSPITAL_BASED_OUTPATIENT_CLINIC_OR_DEPARTMENT_OTHER)
Admission: EM | Admit: 2020-03-27 | Discharge: 2020-03-27 | Discharge status: Against Medical Advice | Payer: Medicaid Other | Attending: Emergency Medicine | Admitting: Emergency Medicine

## 2020-03-27 ENCOUNTER — Encounter (HOSPITAL_BASED_OUTPATIENT_CLINIC_OR_DEPARTMENT_OTHER): Payer: Self-pay | Admitting: *Deleted

## 2020-03-27 DIAGNOSIS — N739 Female pelvic inflammatory disease, unspecified: Secondary | ICD-10-CM | POA: Insufficient documentation

## 2020-03-27 DIAGNOSIS — F17210 Nicotine dependence, cigarettes, uncomplicated: Secondary | ICD-10-CM | POA: Diagnosis not present

## 2020-03-27 DIAGNOSIS — I10 Essential (primary) hypertension: Secondary | ICD-10-CM | POA: Insufficient documentation

## 2020-03-27 DIAGNOSIS — N898 Other specified noninflammatory disorders of vagina: Secondary | ICD-10-CM | POA: Diagnosis present

## 2020-03-27 DIAGNOSIS — B9689 Other specified bacterial agents as the cause of diseases classified elsewhere: Secondary | ICD-10-CM | POA: Insufficient documentation

## 2020-03-27 DIAGNOSIS — N730 Acute parametritis and pelvic cellulitis: Secondary | ICD-10-CM

## 2020-03-27 LAB — CBC WITH DIFFERENTIAL/PLATELET
Abs Immature Granulocytes: 0.01 10*3/uL (ref 0.00–0.07)
Basophils Absolute: 0 10*3/uL (ref 0.0–0.1)
Basophils Relative: 0 %
Eosinophils Absolute: 0.1 10*3/uL (ref 0.0–0.5)
Eosinophils Relative: 1 %
HCT: 41.3 % (ref 36.0–46.0)
Hemoglobin: 13.2 g/dL (ref 12.0–15.0)
Immature Granulocytes: 0 %
Lymphocytes Relative: 29 %
Lymphs Abs: 1.8 10*3/uL (ref 0.7–4.0)
MCH: 28.1 pg (ref 26.0–34.0)
MCHC: 32 g/dL (ref 30.0–36.0)
MCV: 88.1 fL (ref 80.0–100.0)
Monocytes Absolute: 0.6 10*3/uL (ref 0.1–1.0)
Monocytes Relative: 9 %
Neutro Abs: 3.7 10*3/uL (ref 1.7–7.7)
Neutrophils Relative %: 61 %
Platelets: 255 10*3/uL (ref 150–400)
RBC: 4.69 MIL/uL (ref 3.87–5.11)
RDW: 14.3 % (ref 11.5–15.5)
WBC: 6.2 10*3/uL (ref 4.0–10.5)
nRBC: 0 % (ref 0.0–0.2)

## 2020-03-27 LAB — WET PREP, GENITAL
Sperm: NONE SEEN
Yeast Wet Prep HPF POC: NONE SEEN

## 2020-03-27 LAB — URINALYSIS, ROUTINE W REFLEX MICROSCOPIC
Bilirubin Urine: NEGATIVE
Glucose, UA: NEGATIVE mg/dL
Ketones, ur: NEGATIVE mg/dL
Nitrite: NEGATIVE
Protein, ur: 30 mg/dL — AB
Specific Gravity, Urine: 1.03 (ref 1.005–1.030)
pH: 6 (ref 5.0–8.0)

## 2020-03-27 LAB — URINALYSIS, MICROSCOPIC (REFLEX)

## 2020-03-27 LAB — BASIC METABOLIC PANEL
Anion gap: 7 (ref 5–15)
BUN: 9 mg/dL (ref 6–20)
CO2: 26 mmol/L (ref 22–32)
Calcium: 8.7 mg/dL — ABNORMAL LOW (ref 8.9–10.3)
Chloride: 104 mmol/L (ref 98–111)
Creatinine, Ser: 0.95 mg/dL (ref 0.44–1.00)
GFR, Estimated: >60 mL/min (ref >60)
Glucose, Bld: 81 mg/dL (ref 70–99)
Potassium: 3.9 mmol/L (ref 3.5–5.1)
Sodium: 137 mmol/L (ref 135–145)

## 2020-03-27 LAB — PREGNANCY, URINE: Preg Test, Ur: NEGATIVE

## 2020-03-27 MED ORDER — CEFTRIAXONE SODIUM 500 MG IJ SOLR
500.0000 mg | Freq: Once | INTRAMUSCULAR | Status: AC
  Administered 2020-03-27: 500 mg via INTRAMUSCULAR
  Filled 2020-03-27: qty 500

## 2020-03-27 MED ORDER — METRONIDAZOLE 500 MG PO TABS: 500.0000 mg | ORAL_TABLET | Freq: Two times a day (BID) | ORAL | 0 refills | Status: AC

## 2020-03-27 MED ORDER — DOXYCYCLINE HYCLATE 100 MG PO CAPS: 100.0000 mg | ORAL_CAPSULE | Freq: Two times a day (BID) | ORAL | 0 refills | Status: DC

## 2020-03-27 NOTE — ED Notes (Signed)
ED Provider at bedside. 

## 2020-03-27 NOTE — ED Provider Notes (Signed)
Patient originally presented 2 hours ago to the emergency department with pelvic pain and vaginal discharge. Full work-up and note done below, however patient left AMA before I was able to treat her for PID.  Patient returned back to the emergency department after leaving for the past hour to pick up her son. Same treatment plan discussed, did provide IM Rocephin here today in the concerns for PID and had patient pick up her doxycycline and metronidazole. In regards to work-up, CBC and BMP unremarkable. Wet prep concern for BV and trichomonas. Urinalysis most likely contaminated with trichomonas, no bacteria or WBC known, unlikely to be UTI. GC pending. Patient to be treated for this at this time. Will have patient follow-up with PCP. Strict return precautions given. Advised patient that she needs to have her partner checked and to not have intercourse for the next 3 weeks. Patient expressed understanding. Patient be discharged.   Farrel Gordon, PA-C 03/27/20 1651    Tegeler, Canary Brim, MD 03/28/20 [phone]

## 2020-03-27 NOTE — ED Notes (Signed)
Pt returns for ABX

## 2020-03-27 NOTE — Discharge Instructions (Signed)
You are seen today for pelvic inflammatory disease, this is most likely caused from STDs. Your work-up today does show you are positive for trichomonas this is an STD. Please use the attached instructions on this. Your gonorrhea chlamydia will result in 2 days, we did treat you for both of these as well. Please pick up your medications as prescribed.  do know that there are many side effects of this medication which you can speak to your pharmacist about in depth. Please refrain from intercourse for the next 3 weeks, have your partner tested. If you have any new worsening concerning symptoms please come back to the emergency department. If you also have a urinary tract infection, your urine culture will grow out bacteria, keep an eye on this on MyChart. If you continue to have urinary symptoms after a week please come back to the emergency department or follow-up with your primary care provider.

## 2020-03-27 NOTE — ED Triage Notes (Signed)
Pt c/o vaginal d/c and pain x 1 week-NAD-steady gait

## 2020-03-27 NOTE — ED Provider Notes (Signed)
[facility] EMERGENCY DEPARTMENT Provider Note   CSN: [id number] Arrival date & time: 03/27/20  1305     History Chief Complaint  Patient presents with  . Vaginal Discharge    Alicia Simon is a 25 y.o. female with past medical history of hypertension the presents emergency department today for vaginal discharge.  Patient states that for the past week she is has some vaginal discharge and pain.  Patient states that she had unprotected intercourse a week ago, started having vaginal pain and vaginal discharge the day later.  Patient states that vaginal discharge is mucousy with some bloody streaks.  Patient states that her vaginal pain is on the outer areas of her vagina.  States that she also has a burning sensation after she is finished peeing, not during or before.  No hesitancy or frequency.  Patient denies any abdominal pain, nausea or vomiting.  Denies any fevers.  Patient does have Mirena.  No recent antibiotics.  States that she is unsure if her partner has any STDs, he is not showing any signs of STDs.  Has not been taking anything for this.  No other complaints at this time.  HPI     Past Medical History:  Diagnosis Date  . Hypertension     There are no problems to display for this patient.   Past Surgical History:  Procedure Laterality Date  . WISDOM TOOTH EXTRACTION       OB History    Gravida  2   Para  2   Term  2   Preterm  0   AB  0   Living  2     SAB  0   IAB  0   Ectopic  0   Multiple  0   Live Births  2           No family history on file.  Social History   Tobacco Use  . Smoking status: Current Every Day Smoker    Packs/day: 0.25    Years: 0.50    Pack years: 0.12    Types: Cigarettes  . Smokeless tobacco: Never Used  Substance Use Topics  . Alcohol use: No  . Drug use: No    Home Medications Prior to Admission medications   Medication Sig Start Date End Date Taking? Authorizing Provider  doxycycline  (VIBRAMYCIN) 100 MG capsule Take 1 capsule (100 mg total) by mouth 2 (two) times daily for 14 days. 03/27/20 04/10/20 Yes Morelia Cassells, PA-C  metroNIDAZOLE (FLAGYL) 500 MG tablet Take 1 tablet (500 mg total) by mouth 2 (two) times daily for 14 days. 03/27/20 04/10/20 Yes Avilene Marrin, PA-C  azithromycin (ZITHROMAX) 250 MG tablet Take 1 tablet (250 mg total) by mouth daily. You have been given your first dose of the azithromycin today in the hospital.  Please take 1 pill daily for 4 days starting tomorrow. Patient not taking: Reported on 11/03/2018 12/17/17   Harlene Salts A, PA-C  ciprofloxacin (CIPRO) 500 MG tablet Take 1 tablet (500 mg total) by mouth 2 (two) times daily. Patient not taking: Reported on 04/14/2016 03/04/16   Katrinka Blazing, [place], CNM  cyclobenzaprine (FLEXERIL) 10 MG tablet Take 1 tablet (10 mg total) by mouth 2 (two) times daily as needed for muscle spasms. 11/03/18   Robinson, [place] N, PA-C  HYDROcodone-acetaminophen (NORCO) 5-325 MG tablet Take 1 tablet by mouth every 4 (four) hours as needed. Patient not taking: Reported on 11/03/2018 04/14/16   Mancel Bale, MD  ibuprofen (ADVIL,MOTRIN) 600 MG tablet Take 1 tablet (600 mg total) by mouth every 6 (six) hours as needed. Patient not taking: Reported on 11/03/2018 02/27/16   Katrinka Blazing, [place], CNM  naproxen sodium (ANAPROX) 220 MG tablet Take 220 mg by mouth 2 (two) times daily as needed (pain, headache).    [provider]  phenazopyridine (PYRIDIUM) 200 MG tablet TAKE 1 TABLET(200 MG) BY MOUTH THREE TIMES DAILY AS NEEDED FOR PAIN Patient not taking: Reported on 04/14/2016 02/27/16   [name], CNM    Allergies    Patient has no known allergies.  Review of Systems   Review of Systems  Constitutional: Negative for chills, diaphoresis, fatigue and fever.  HENT: Negative for congestion, sore throat and trouble swallowing.   Eyes: Negative for pain and visual disturbance.  Respiratory: Negative for cough, shortness of breath  and wheezing.   Cardiovascular: Negative for chest pain, palpitations and leg swelling.  Gastrointestinal: Negative for abdominal distention, abdominal pain, diarrhea, nausea and vomiting.  Genitourinary: Positive for pelvic pain, vaginal discharge and vaginal pain. Negative for decreased urine volume, difficulty urinating, dyspareunia, dysuria, frequency, genital sores, hematuria, menstrual problem and urgency.  Musculoskeletal: Negative for back pain, neck pain and neck stiffness.  Skin: Negative for pallor.  Neurological: Negative for dizziness, speech difficulty, weakness and headaches.  Psychiatric/Behavioral: Negative for confusion.    Physical Exam Updated Vital Signs BP 128/82 (BP Location: Left Arm)   Pulse (!) 116   Temp 98.3 F (36.8 C) (Oral)   Resp 18   Ht 5\' 3"  (1.6 m)   Wt 119.3 kg   SpO2 100%   BMI 46.59 kg/m   Physical Exam Constitutional:      General: She is not in acute distress.    Appearance: Normal appearance. She is not ill-appearing, toxic-appearing or diaphoretic.  HENT:     Mouth/Throat:     Mouth: Mucous membranes are moist.     Pharynx: Oropharynx is clear.  Eyes:     General: No scleral icterus.    Extraocular Movements: Extraocular movements intact.     Pupils: Pupils are equal, round, and reactive to light.  Cardiovascular:     Rate and Rhythm: Normal rate and regular rhythm.     Pulses: Normal pulses.     Heart sounds: Normal heart sounds.  Pulmonary:     Effort: Pulmonary effort is normal. No respiratory distress.     Breath sounds: Normal breath sounds. No stridor. No wheezing, rhonchi or rales.  Chest:     Chest wall: No tenderness.  Abdominal:     General: Abdomen is flat. There is no distension.     Palpations: Abdomen is soft.     Tenderness: There is no abdominal tenderness. There is no guarding or rebound.  Genitourinary:    Comments: Chaperone present.  Patient with moderate discharge white in color, no blood.  Coming out of  cervix, cervix is closed.  Moderate tenderness with speculum exam, however patient was able to tolerate this.  Patient did have severe cervical motion tenderness and adnexal tenderness, worse on left. Musculoskeletal:        General: No swelling or tenderness. Normal range of motion.     Cervical back: Normal range of motion and neck supple. No rigidity.     Right lower leg: No edema.     Left lower leg: No edema.  Skin:    General: Skin is warm and dry.     Capillary Refill: Capillary refill takes less  than 2 seconds.     Coloration: Skin is not pale.  Neurological:     General: No focal deficit present.     Mental Status: She is alert and oriented to person, place, and time.  Psychiatric:        Mood and Affect: Mood normal.        Behavior: Behavior normal.     ED Results / Procedures / Treatments   Labs (all labs ordered are listed, but only abnormal results are displayed) Labs Reviewed  WET PREP, GENITAL  CBC WITH DIFFERENTIAL/PLATELET  URINALYSIS, ROUTINE W REFLEX MICROSCOPIC  PREGNANCY, URINE  BASIC METABOLIC PANEL  GC/CHLAMYDIA PROBE AMP ([place]) NOT AT [facility]    EKG None  Radiology No results found.  Procedures Procedures   Medications Ordered in ED Medications - No data to display  ED Course  I have reviewed the triage vital signs and the nursing notes.  Pertinent labs & imaging results that were available during my care of the patient were reviewed by me and considered in my medical decision making (see chart for details).    MDM Rules/Calculators/A&P                         YASMENE SALOMONE is a 25 y.o. female with past medical history of hypertension the presents emergency department today for vaginal discharge.  Pelvic and HPI with concerns for PID, blood work in process.  Patient states that she needs to leave to go pick up her son and then will check back in.  Discussed risks of this, however patient is still going to leave.  Patient still wants  to be t treated for this, however states that she needs to leave immediately.  Patient to leave AMA, is able to make her own decisions at this time.  Blood work pending.     Final Clinical Impression(s) / ED Diagnoses Final diagnoses:  PID (acute pelvic inflammatory disease)    Rx / DC Orders ED Discharge Orders         Ordered    doxycycline (VIBRAMYCIN) 100 MG capsule  2 times daily        03/27/20 1418    metroNIDAZOLE (FLAGYL) 500 MG tablet  2 times daily        03/27/20 1418           Farrel Gordon, PA-C 03/27/20 1652    Cheryll Cockayne, MD 04/01/20 1455

## 2020-03-27 NOTE — ED Triage Notes (Signed)
Pt seen here today left AMA returns for medication

## 2020-03-27 NOTE — ED Notes (Signed)
Pt states she is leaving to pick up here son and will be back to finish tx, AMA signed

## 2020-03-28 LAB — GC/CHLAMYDIA PROBE AMP (~~LOC~~) NOT AT ARMC
Chlamydia: NEGATIVE
Comment: NEGATIVE
Comment: NORMAL
Neisseria Gonorrhea: NEGATIVE

## 2020-03-29 LAB — URINE CULTURE: Culture: 20000 — AB

## 2020-04-18 NOTE — ED Provider Notes (Signed)
[facility] EMERGENCY DEPARTMENT Provider Note   CSN: [id number] Arrival date & time: 03/27/20  1511     History Chief Complaint  Patient presents with  . Vaginal Discharge    Alicia Simon is a 25 y.o. female with past medical history of hypertension the presents emergency department today for vaginal discharge.  Patient states that for the past week she is has some vaginal discharge and pain.  Patient states that she had unprotected intercourse a week ago, started having vaginal pain and vaginal discharge the day later.  Patient states that vaginal discharge is mucousy with some bloody streaks.  Patient states that her vaginal pain is on the outer areas of her vagina.  States that she also has a burning sensation after she is finished peeing, not during or before.  No hesitancy or frequency.  Patient denies any abdominal pain, nausea or vomiting.  Denies any fevers.  Patient does have Mirena.  No recent antibiotics.  States that she is unsure if her partner has any STDs, he is not showing any signs of STDs.  Has not been taking anything for this.  No other complaints at this time.      Past Medical History:  Diagnosis Date  . Hypertension     There are no problems to display for this patient.   Past Surgical History:  Procedure Laterality Date  . WISDOM TOOTH EXTRACTION       OB History    Gravida  2   Para  2   Term  2   Preterm  0   AB  0   Living  2     SAB  0   IAB  0   Ectopic  0   Multiple  0   Live Births  2           No family history on file.  Social History   Tobacco Use  . Smoking status: Current Every Day Smoker    Packs/day: 0.25    Years: 0.50    Pack years: 0.12    Types: Cigarettes  . Smokeless tobacco: Never Used  Substance Use Topics  . Alcohol use: No  . Drug use: No    Home Medications Prior to Admission medications   Medication Sig Start Date End Date Taking? Authorizing Provider  cyclobenzaprine  (FLEXERIL) 10 MG tablet Take 1 tablet (10 mg total) by mouth 2 (two) times daily as needed for muscle spasms. 11/03/18   Robinson, [place] N, PA-C  HYDROcodone-acetaminophen (NORCO) 5-325 MG tablet Take 1 tablet by mouth every 4 (four) hours as needed. Patient not taking: Reported on 11/03/2018 04/14/16   Mancel Bale, MD  ibuprofen (ADVIL,MOTRIN) 600 MG tablet Take 1 tablet (600 mg total) by mouth every 6 (six) hours as needed. Patient not taking: Reported on 11/03/2018 02/27/16   Katrinka Blazing, [place], CNM  naproxen sodium (ANAPROX) 220 MG tablet Take 220 mg by mouth 2 (two) times daily as needed (pain, headache).    [provider]  phenazopyridine (PYRIDIUM) 200 MG tablet TAKE 1 TABLET(200 MG) BY MOUTH THREE TIMES DAILY AS NEEDED FOR PAIN Patient not taking: Reported on 04/14/2016 02/27/16   [name], CNM    Allergies    Patient has no known allergies.  Review of Systems   Review of Systems  Constitutional: Negative for chills, diaphoresis, fatigue and fever.  HENT: Negative for congestion, sore throat and trouble swallowing.   Eyes: Negative for pain and visual disturbance.  Respiratory: Negative for cough, shortness of breath and wheezing.   Cardiovascular: Negative for chest pain, palpitations and leg swelling.  Gastrointestinal: Negative for abdominal distention, abdominal pain, diarrhea, nausea and vomiting.  Genitourinary: Positive for pelvic pain, vaginal discharge and vaginal pain. Negative for decreased urine volume, difficulty urinating, dyspareunia, dysuria, frequency, genital sores, hematuria, menstrual problem and urgency.  Musculoskeletal: Negative for back pain, neck pain and neck stiffness.  Skin: Negative for pallor.  Neurological: Negative for dizziness, speech difficulty, weakness and headaches.  Psychiatric/Behavioral: Negative for confusion.    Physical Exam Updated Vital Signs BP 135/90   Pulse 86   Temp 98.4 F (36.9 C) (Oral)   Resp (!) 6   Ht 5\' 3"   (1.6 m)   Wt 119.7 kg   SpO2 100%   BMI 46.77 kg/m   Physical Exam Constitutional:      General: She is not in acute distress.    Appearance: Normal appearance. She is not ill-appearing, toxic-appearing or diaphoretic.  HENT:     Mouth/Throat:     Mouth: Mucous membranes are moist.     Pharynx: Oropharynx is clear.  Eyes:     General: No scleral icterus.    Extraocular Movements: Extraocular movements intact.     Pupils: Pupils are equal, round, and reactive to light.  Cardiovascular:     Rate and Rhythm: Normal rate and regular rhythm.     Pulses: Normal pulses.     Heart sounds: Normal heart sounds.  Pulmonary:     Effort: Pulmonary effort is normal. No respiratory distress.     Breath sounds: Normal breath sounds. No stridor. No wheezing, rhonchi or rales.  Chest:     Chest wall: No tenderness.  Abdominal:     General: Abdomen is flat. There is no distension.     Palpations: Abdomen is soft.     Tenderness: There is no abdominal tenderness. There is no guarding or rebound.  Genitourinary:    Comments: Chaperone present.  Patient with moderate discharge white in color, no blood.  Coming out of cervix, cervix is closed.  Moderate tenderness with speculum exam, however patient was able to tolerate this.  Patient did have severe cervical motion tenderness and adnexal tenderness, worse on left. Musculoskeletal:        General: No swelling or tenderness. Normal range of motion.     Cervical back: Normal range of motion and neck supple. No rigidity.     Right lower leg: No edema.     Left lower leg: No edema.  Skin:    General: Skin is warm and dry.     Capillary Refill: Capillary refill takes less than 2 seconds.     Coloration: Skin is not pale.  Neurological:     General: No focal deficit present.     Mental Status: She is alert and oriented to person, place, and time.  Psychiatric:        Mood and Affect: Mood normal.        Behavior: Behavior normal.     ED Results  / Procedures / Treatments   Labs (all labs ordered are listed, but only abnormal results are displayed) Labs Reviewed  URINE CULTURE - Abnormal; Notable for the following components:      Result Value   Culture   (*)    Value: 20,000 COLONIES/mL MULTIPLE SPECIES PRESENT, SUGGEST RECOLLECTION   All other components within normal limits    EKG None  Radiology No results found.  Procedures Procedures   Medications Ordered in ED Medications  cefTRIAXone (ROCEPHIN) injection 500 mg (500 mg Intramuscular Given 03/27/20 1555)    ED Course  I have reviewed the triage vital signs and the nursing notes.  Pertinent labs & imaging results that were available during my care of the patient were reviewed by me and considered in my medical decision making (see chart for details).    MDM Rules/Calculators/A&P                         Alicia Simon is a 25 y.o. female with past medical history of hypertension the presents emergency department today for vaginal discharge.  Pelvic and HPI with concerns for PID, Pt left AMA orginally, is now back.   Patient returned back to the emergency department after leaving for the past hour to pick up her son. Same treatment plan discussed, did provide IM Rocephin here today in the concerns for PID and had patient pick up her doxycycline and metronidazole. In regards to work-up, CBC and BMP unremarkable. Wet prep concern for BV and trichomonas. Urinalysis most likely contaminated with trichomonas, no bacteria or WBC known, unlikely to be UTI. GC pending. Patient to be treated for this at this time. Will have patient follow-up with PCP. Strict return precautions given. Advised patient that she needs to have her partner checked and to not have intercourse for the next 3 weeks. Patient expressed understanding. Patient be discharged.     Final Clinical Impression(s) / ED Diagnoses Final diagnoses:  PID (acute pelvic inflammatory disease)    Rx / DC  Orders ED Discharge Orders    None          Farrel Gordon, PA-C 04/18/20 1884    Tegeler, Canary Brim, MD 04/20/20 [phone]

## 2020-09-19 ENCOUNTER — Emergency Department (HOSPITAL_BASED_OUTPATIENT_CLINIC_OR_DEPARTMENT_OTHER): Payer: Medicaid Other

## 2020-09-19 ENCOUNTER — Encounter (HOSPITAL_BASED_OUTPATIENT_CLINIC_OR_DEPARTMENT_OTHER): Payer: Self-pay

## 2020-09-19 ENCOUNTER — Emergency Department (HOSPITAL_BASED_OUTPATIENT_CLINIC_OR_DEPARTMENT_OTHER)
Admission: EM | Admit: 2020-09-19 | Discharge: 2020-09-19 | Disposition: A | Discharge status: Home / Self Care | Payer: Medicaid Other | Attending: Emergency Medicine | Admitting: Emergency Medicine

## 2020-09-19 ENCOUNTER — Other Ambulatory Visit: Payer: Self-pay

## 2020-09-19 DIAGNOSIS — S8392XA Sprain of unspecified site of left knee, initial encounter: Secondary | ICD-10-CM

## 2020-09-19 DIAGNOSIS — I10 Essential (primary) hypertension: Secondary | ICD-10-CM | POA: Diagnosis not present

## 2020-09-19 DIAGNOSIS — S8991XA Unspecified injury of right lower leg, initial encounter: Secondary | ICD-10-CM | POA: Diagnosis present

## 2020-09-19 DIAGNOSIS — S8391XA Sprain of unspecified site of right knee, initial encounter: Secondary | ICD-10-CM | POA: Insufficient documentation

## 2020-09-19 DIAGNOSIS — Y990 Civilian activity done for income or pay: Secondary | ICD-10-CM | POA: Insufficient documentation

## 2020-09-19 DIAGNOSIS — F17210 Nicotine dependence, cigarettes, uncomplicated: Secondary | ICD-10-CM | POA: Diagnosis not present

## 2020-09-19 DIAGNOSIS — X501XXA Overexertion from prolonged static or awkward postures, initial encounter: Secondary | ICD-10-CM | POA: Diagnosis not present

## 2020-09-19 MED ORDER — NAPROXEN 500 MG PO TABS: 500.0000 mg | ORAL_TABLET | Freq: Two times a day (BID) | ORAL | 0 refills | Status: AC

## 2020-09-19 NOTE — Discharge Instructions (Addendum)
Your x-ray today was normal.  As discussed, you may have a muscle/ligament injury.  Take naproxen as prescribed.  Use crutches and weight-bear as tolerated.  Follow-up with sports medicine, call to schedule an appointment.
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
## 2020-09-19 NOTE — ED Notes (Signed)
Patient transported to X-ray 
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
## 2020-09-27 ENCOUNTER — Ambulatory Visit: Payer: Medicaid Other | Admitting: Family Medicine

## 2020-09-27 NOTE — Progress Notes (Deleted)
  Alicia Simon - 25 y.o. female MRN [id number]  Date of birth: 1995/05/31  SUBJECTIVE:  Including CC & ROS.  No chief complaint on file.   Alicia Simon is a 25 y.o. female that is  ***.  ***   Review of Systems See HPI   HISTORY: Past Medical, Surgical, Social, and Family History Reviewed & Updated per EMR.   Pertinent Historical Findings include:  Past Medical History:  Diagnosis Date   Hypertension     Past Surgical History:  Procedure Laterality Date   WISDOM TOOTH EXTRACTION      No family history on file.  Social History   Socioeconomic History   Marital status: Single    Spouse name: Not on file   Number of children: Not on file   Years of education: Not on file   Highest education level: Not on file  Occupational History   Not on file  Tobacco Use   Smoking status: Every Day    Packs/day: 0.25    Years: 0.50    Pack years: 0.13    Types: Cigarettes   Smokeless tobacco: Never  Substance and Sexual Activity   Alcohol use: No   Drug use: No   Sexual activity: Not on file  Other Topics Concern   Not on file  Social History Narrative   Not on file   Social Determinants of Health   Financial Resource Strain: Not on file  Food Insecurity: Not on file  Transportation Needs: Not on file  Physical Activity: Not on file  Stress: Not on file  Social Connections: Not on file  Intimate Partner Violence: Not on file     PHYSICAL EXAM:  VS: There were no vitals taken for this visit. Physical Exam Gen: NAD, alert, cooperative with exam, well-appearing MSK:  ***      ASSESSMENT & PLAN:   No problem-specific Assessment & Plan notes found for this encounter.

## 2020-10-06 IMAGING — CR DG LUMBAR SPINE COMPLETE 4+V
5 series · 5 of 5 positions shown · non-contrast
Comparison: None.

CLINICAL DATA: Pain following fall

EXAM:
LUMBAR SPINE - COMPLETE 4+ VIEW

[t lumbar spine ap]
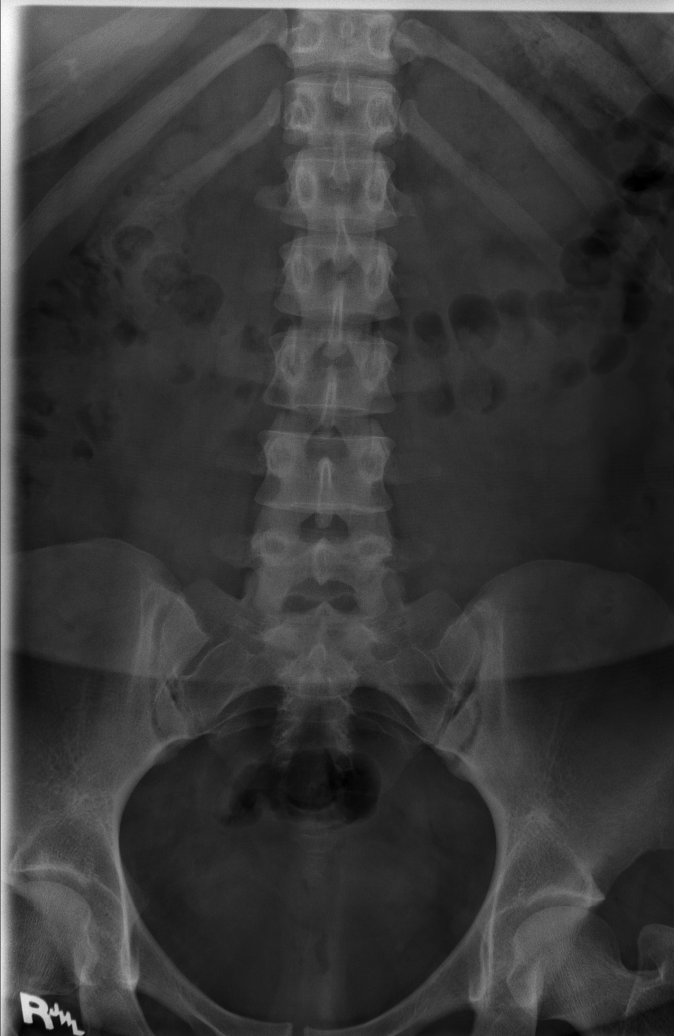

[t lumbar spine obl (1 of 2)]
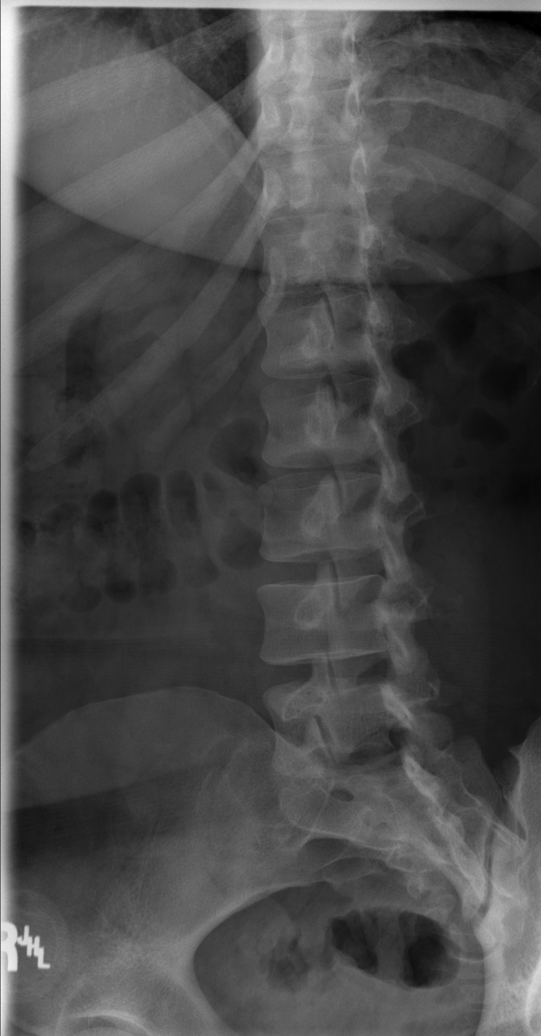

[t lumbar spine obl (2 of 2)]
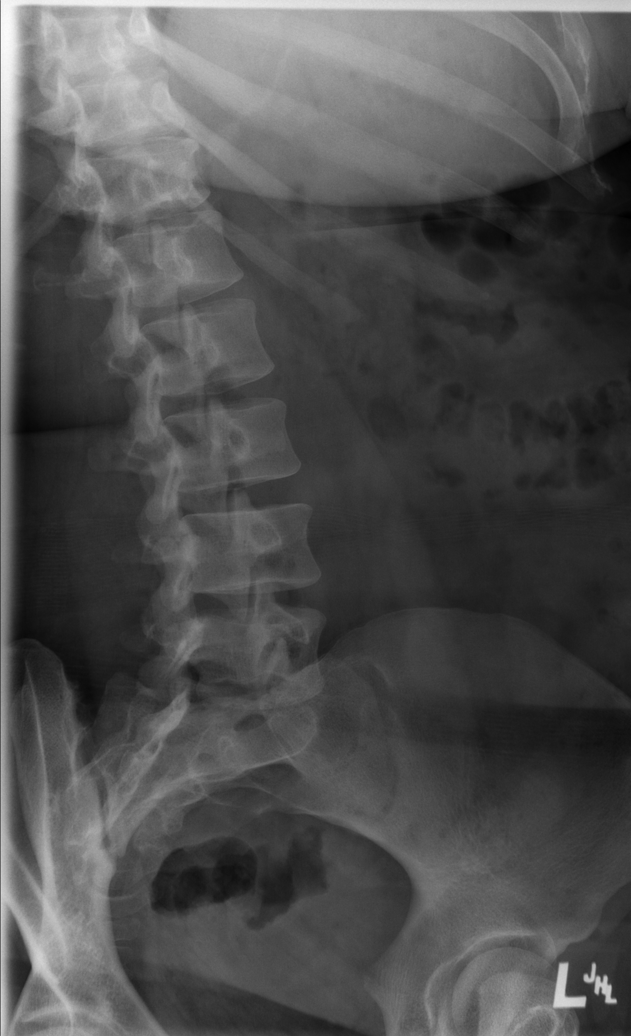

[t lumbar spine lat]
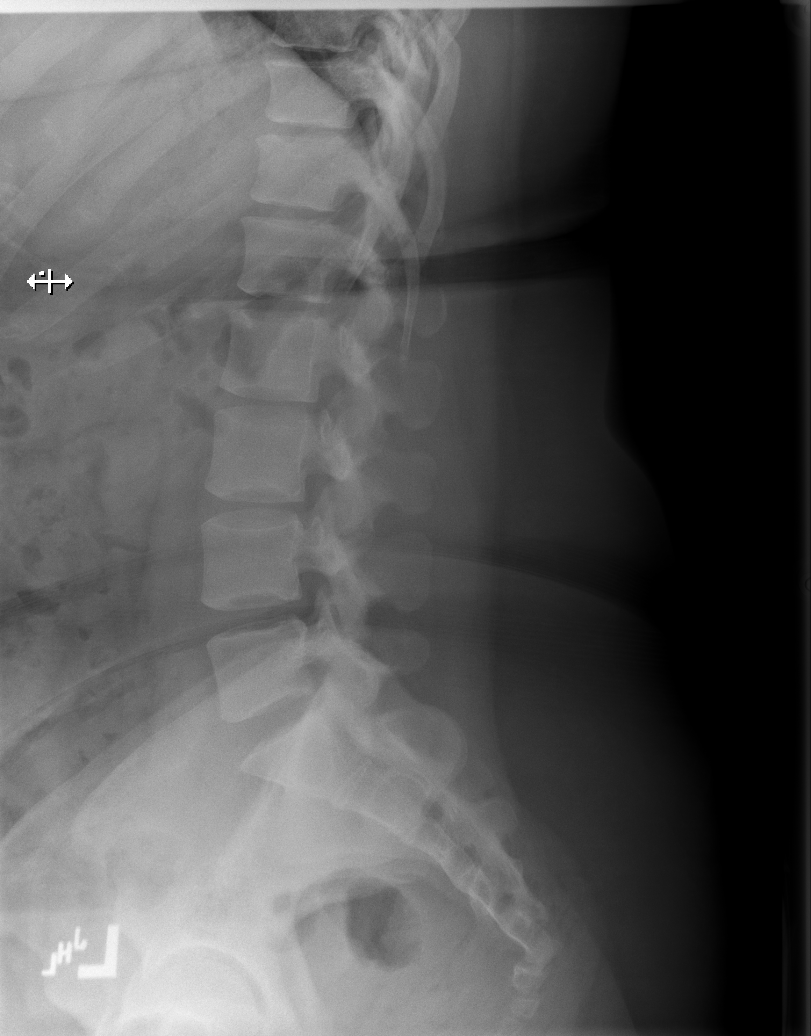

[t lumbar l-5 s-1 spot]
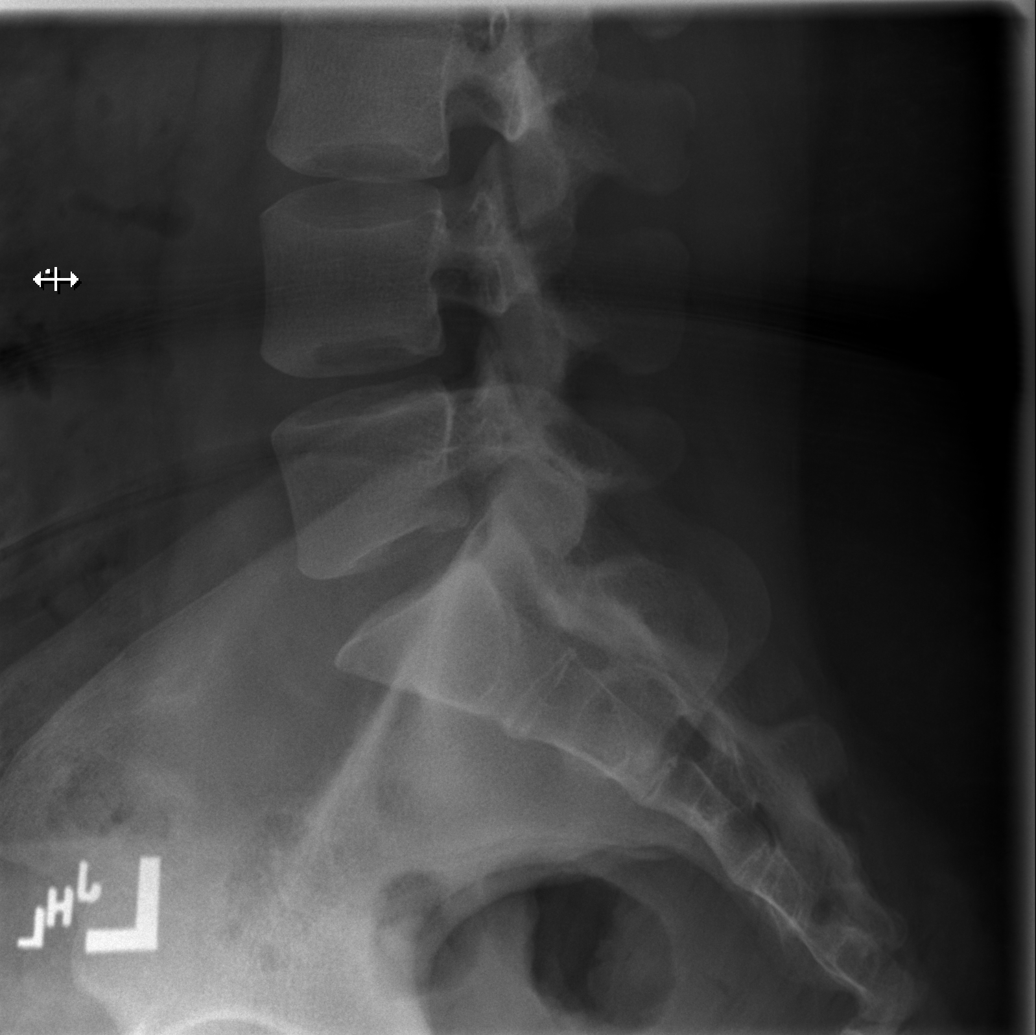

[5 of 5 positions shown; findings below may reference images not displayed]

FINDINGS: Frontal, lateral, spot lumbosacral lateral, and bilateral oblique
views were obtained. There are 5 non-rib-bearing lumbar type
vertebral bodies. There is no fracture or spondylolisthesis. The
disc spaces appear unremarkable. There is no appreciable facet
arthropathy.
IMPRESSION: No fracture or spondylolisthesis.  No evident arthropathy.

## 2020-10-23 ENCOUNTER — Other Ambulatory Visit: Payer: Self-pay | Admitting: Student in an Organized Health Care Education/Training Program

## 2020-10-23 DIAGNOSIS — R102 Pelvic and perineal pain: Secondary | ICD-10-CM

## 2020-10-27 ENCOUNTER — Ambulatory Visit: Payer: Medicaid Other | Admitting: Orthopaedic Surgery

## 2020-10-31 ENCOUNTER — Ambulatory Visit
Admission: RE | Admit: 2020-10-31 | Discharge: 2020-10-31 | Disposition: A | Discharge status: Home / Self Care | Payer: Medicaid Other | Source: Ambulatory Visit | Attending: Student in an Organized Health Care Education/Training Program | Admitting: Student in an Organized Health Care Education/Training Program

## 2020-10-31 DIAGNOSIS — R102 Pelvic and perineal pain: Secondary | ICD-10-CM

## 2020-12-10 ENCOUNTER — Emergency Department (HOSPITAL_BASED_OUTPATIENT_CLINIC_OR_DEPARTMENT_OTHER): Payer: Medicaid Other

## 2020-12-10 ENCOUNTER — Other Ambulatory Visit: Payer: Self-pay

## 2020-12-10 ENCOUNTER — Emergency Department (HOSPITAL_BASED_OUTPATIENT_CLINIC_OR_DEPARTMENT_OTHER)
Admission: EM | Admit: 2020-12-10 | Discharge: 2020-12-10 | Disposition: A | Discharge status: Home / Self Care | Payer: Medicaid Other | Attending: Emergency Medicine | Admitting: Emergency Medicine

## 2020-12-10 ENCOUNTER — Encounter (HOSPITAL_BASED_OUTPATIENT_CLINIC_OR_DEPARTMENT_OTHER): Payer: Self-pay | Admitting: Emergency Medicine

## 2020-12-10 DIAGNOSIS — I10 Essential (primary) hypertension: Secondary | ICD-10-CM | POA: Diagnosis not present

## 2020-12-10 DIAGNOSIS — J069 Acute upper respiratory infection, unspecified: Secondary | ICD-10-CM | POA: Diagnosis not present

## 2020-12-10 DIAGNOSIS — Z20822 Contact with and (suspected) exposure to covid-19: Secondary | ICD-10-CM | POA: Insufficient documentation

## 2020-12-10 DIAGNOSIS — E860 Dehydration: Secondary | ICD-10-CM

## 2020-12-10 DIAGNOSIS — R059 Cough, unspecified: Secondary | ICD-10-CM | POA: Diagnosis present

## 2020-12-10 DIAGNOSIS — F17210 Nicotine dependence, cigarettes, uncomplicated: Secondary | ICD-10-CM | POA: Insufficient documentation

## 2020-12-10 DIAGNOSIS — N9489 Other specified conditions associated with female genital organs and menstrual cycle: Secondary | ICD-10-CM | POA: Insufficient documentation

## 2020-12-10 LAB — CBC WITH DIFFERENTIAL/PLATELET
Abs Immature Granulocytes: 0.01 10*3/uL (ref 0.00–0.07)
Basophils Absolute: 0 10*3/uL (ref 0.0–0.1)
Basophils Relative: 1 %
Eosinophils Absolute: 0 10*3/uL (ref 0.0–0.5)
Eosinophils Relative: 0 %
HCT: 45.1 % (ref 36.0–46.0)
Hemoglobin: 15.2 g/dL — ABNORMAL HIGH (ref 12.0–15.0)
Immature Granulocytes: 0 %
Lymphocytes Relative: 44 %
Lymphs Abs: 1.8 10*3/uL (ref 0.7–4.0)
MCH: 27.9 pg (ref 26.0–34.0)
MCHC: 33.7 g/dL (ref 30.0–36.0)
MCV: 82.9 fL (ref 80.0–100.0)
Monocytes Absolute: 0.4 10*3/uL (ref 0.1–1.0)
Monocytes Relative: 10 %
Neutro Abs: 1.9 10*3/uL (ref 1.7–7.7)
Neutrophils Relative %: 45 %
Platelets: 224 10*3/uL (ref 150–400)
RBC: 5.44 MIL/uL — ABNORMAL HIGH (ref 3.87–5.11)
RDW: 12.9 % (ref 11.5–15.5)
Smear Review: NORMAL
WBC: 4.1 10*3/uL (ref 4.0–10.5)
nRBC: 0 % (ref 0.0–0.2)

## 2020-12-10 LAB — COMPREHENSIVE METABOLIC PANEL
ALT: 67 U/L — ABNORMAL HIGH (ref 0–44)
AST: 40 U/L (ref 15–41)
Albumin: 4.2 g/dL (ref 3.5–5.0)
Alkaline Phosphatase: 60 U/L (ref 38–126)
Anion gap: 10 (ref 5–15)
BUN: 21 mg/dL — ABNORMAL HIGH (ref 6–20)
CO2: 23 mmol/L (ref 22–32)
Calcium: 8.7 mg/dL — ABNORMAL LOW (ref 8.9–10.3)
Chloride: 99 mmol/L (ref 98–111)
Creatinine, Ser: 0.79 mg/dL (ref 0.44–1.00)
GFR, Estimated: >60 mL/min (ref >60)
Glucose, Bld: 106 mg/dL — ABNORMAL HIGH (ref 70–99)
Potassium: 3.5 mmol/L (ref 3.5–5.1)
Sodium: 132 mmol/L — ABNORMAL LOW (ref 135–145)
Total Bilirubin: 0.4 mg/dL (ref 0.3–1.2)
Total Protein: 8.7 g/dL — ABNORMAL HIGH (ref 6.5–8.1)

## 2020-12-10 LAB — LIPASE, BLOOD: Lipase: 37 U/L (ref 11–51)

## 2020-12-10 LAB — HCG, QUANTITATIVE, PREGNANCY: hCG, Beta Chain, Quant, S: <1 m[IU]/mL (ref <5)

## 2020-12-10 MED ORDER — BENZONATATE 100 MG PO CAPS: 100.0000 mg | ORAL_CAPSULE | Freq: Three times a day (TID) | ORAL | 0 refills | Status: AC

## 2020-12-10 MED ORDER — ONDANSETRON 4 MG PO TBDP: 4.0000 mg | ORAL_TABLET | Freq: Three times a day (TID) | ORAL | 0 refills | Status: AC | PRN

## 2020-12-10 MED ORDER — SODIUM CHLORIDE 0.9 % IV BOLUS
1000.0000 mL | Freq: Once | INTRAVENOUS | Status: AC
  Administered 2020-12-10: 1000 mL via INTRAVENOUS

## 2020-12-10 MED ORDER — FLUTICASONE PROPIONATE 50 MCG/ACT NA SUSP: 1.0000 | Freq: Every day | NASAL | 2 refills | Status: AC

## 2020-12-10 MED ORDER — METOCLOPRAMIDE HCL 5 MG/ML IJ SOLN
10.0000 mg | Freq: Once | INTRAMUSCULAR | Status: AC
  Administered 2020-12-10: 10 mg via INTRAVENOUS
  Filled 2020-12-10: qty 2

## 2020-12-10 NOTE — ED Triage Notes (Signed)
Pt reports NVD since Wed pm; sts she is only able to keep water down; son had flu last week

## 2020-12-10 NOTE — ED Provider Notes (Signed)
[facility] Provider Note   CSN: [id number] Arrival date & time: 12/10/20  1550     History Chief Complaint  Patient presents with   Emesis   Diarrhea    Alicia Simon is a 25 y.o. female presenting to the ED for 4-day history of myalgias, cough, congestion, diarrhea and nonbloody, nonbilious emesis.  Her son was diagnosed with flu last week.  She reports some shortness of breath associated with her productive cough.  She is able to tolerate water but states that anytime she tries to eat or drink anything else she will have vomiting.  She has tried Tylenol but her last dose was about 2 days ago.  She denies any chest pain.  Reports generalized abdominal pain due to her vomiting.  No pelvic or vaginal complaints.   Emesis Associated symptoms: chills, cough, diarrhea and myalgias   Associated symptoms: no abdominal pain, no fever and no sore throat   Diarrhea Associated symptoms: chills, myalgias and vomiting   Associated symptoms: no abdominal pain and no fever       Past Medical History:  Diagnosis Date   Hypertension     There are no problems to display for this patient.   Past Surgical History:  Procedure Laterality Date   WISDOM TOOTH EXTRACTION       OB History     Gravida  2   Para  2   Term  2   Preterm  0   AB  0   Living  2      SAB  0   IAB  0   Ectopic  0   Multiple  0   Live Births  2           No family history on file.  Social History   Tobacco Use   Smoking status: Every Day    Packs/day: 0.25    Years: 0.50    Pack years: 0.13    Types: Cigarettes   Smokeless tobacco: Never  Substance Use Topics   Alcohol use: No   Drug use: No    Home Medications Prior to Admission medications   Medication Sig Start Date End Date Taking? Authorizing Provider  benzonatate (TESSALON) 100 MG capsule Take 1 capsule (100 mg total) by mouth every 8 (eight) hours. 12/10/20  Yes Tanielle Emigh, PA-C   fluticasone (FLONASE) 50 MCG/ACT nasal spray Place 1 spray into both nostrils daily. 12/10/20  Yes Ernesta Trabert, PA-C  ondansetron (ZOFRAN ODT) 4 MG disintegrating tablet Take 1 tablet (4 mg total) by mouth every 8 (eight) hours as needed for nausea or vomiting. 12/10/20  Yes Lindora Alviar, PA-C  cyclobenzaprine (FLEXERIL) 10 MG tablet Take 1 tablet (10 mg total) by mouth 2 (two) times daily as needed for muscle spasms. 11/03/18   Robinson, [place] N, PA-C  HYDROcodone-acetaminophen (NORCO) 5-325 MG tablet Take 1 tablet by mouth every 4 (four) hours as needed. Patient not taking: Reported on 11/03/2018 04/14/16   Daleen Bo, MD  ibuprofen (ADVIL,MOTRIN) 600 MG tablet Take 1 tablet (600 mg total) by mouth every 6 (six) hours as needed. Patient not taking: Reported on 11/03/2018 02/27/16   Tamala Julian, [place], CNM  naproxen (NAPROSYN) 500 MG tablet Take 1 tablet (500 mg total) by mouth 2 (two) times daily. 09/19/20   Tacy Learn, PA-C  naproxen sodium (ANAPROX) 220 MG tablet Take 220 mg by mouth 2 (two) times daily as needed (pain, headache).    [provider]  phenazopyridine (PYRIDIUM) 200 MG tablet TAKE 1 TABLET(200 MG) BY MOUTH THREE TIMES DAILY AS NEEDED FOR PAIN Patient not taking: Reported on 04/14/2016 02/27/16   Manya Silvas, [place]    Allergies    Patient has no known allergies.  Review of Systems   Review of Systems  Constitutional:  Positive for chills. Negative for appetite change and fever.  HENT:  Positive for congestion. Negative for ear pain, rhinorrhea, sneezing and sore throat.   Eyes:  Negative for photophobia and visual disturbance.  Respiratory:  Positive for cough and shortness of breath. Negative for chest tightness and wheezing.   Cardiovascular:  Negative for chest pain and palpitations.  Gastrointestinal:  Positive for diarrhea and vomiting. Negative for abdominal pain, blood in stool, constipation and nausea.  Genitourinary:  Negative for dysuria, hematuria and  urgency.  Musculoskeletal:  Positive for myalgias.  Skin:  Negative for rash.  Neurological:  Negative for dizziness, weakness and light-headedness.   Physical Exam Updated Vital Signs BP 111/76 (BP Location: Left Arm)   Pulse 85   Temp 98.6 F (37 C) (Oral)   Resp 15   Ht 5\' 2"  (1.575 m)   Wt 116.1 kg   LMP 12/05/2020   SpO2 100%   BMI 46.82 kg/m   Physical Exam Vitals and nursing note reviewed.  Constitutional:      General: She is not in acute distress.    Appearance: She is well-developed.  HENT:     Head: Normocephalic and atraumatic.     Nose: Nose normal.  Eyes:     General: No scleral icterus.       Right eye: No discharge.        Left eye: No discharge.     Conjunctiva/sclera: Conjunctivae normal.  Cardiovascular:     Rate and Rhythm: Normal rate and regular rhythm.     Heart sounds: Normal heart sounds. No murmur heard.   No friction rub. No gallop.  Pulmonary:     Effort: Pulmonary effort is normal. No respiratory distress.     Breath sounds: Normal breath sounds.  Abdominal:     General: Bowel sounds are normal. There is no distension.     Palpations: Abdomen is soft.     Tenderness: There is no abdominal tenderness. There is no guarding.  Musculoskeletal:        General: Normal range of motion.     Cervical back: Normal range of motion and neck supple.  Skin:    General: Skin is warm and dry.     Findings: No rash.  Neurological:     Mental Status: She is alert.     Motor: No abnormal muscle tone.     Coordination: Coordination normal.    ED Results / Procedures / Treatments   Labs (all labs ordered are listed, but only abnormal results are displayed) Labs Reviewed  COMPREHENSIVE METABOLIC PANEL - Abnormal; Notable for the following components:      Result Value   Sodium 132 (*)    Glucose, Bld 106 (*)    BUN 21 (*)    Calcium 8.7 (*)    Total Protein 8.7 (*)    ALT 67 (*)    All other components within normal limits  CBC WITH  DIFFERENTIAL/PLATELET - Abnormal; Notable for the following components:   RBC 5.44 (*)    Hemoglobin 15.2 (*)    All other components within normal limits  RESP PANEL BY RT-PCR (FLU A&B, COVID) ARPGX2  LIPASE, BLOOD  HCG, QUANTITATIVE, PREGNANCY    EKG None  Radiology DG Chest Portable 1 View  Result Date: 12/10/2020 CLINICAL DATA:  Cough, shortness of breath. EXAM: PORTABLE CHEST 1 VIEW COMPARISON:  December 16, 2017. FINDINGS: The heart size and mediastinal contours are within normal limits. Both lungs are clear. The visualized skeletal structures are unremarkable. IMPRESSION: No active disease. Electronically Signed   By: Marijo Conception M.D.   On: 12/10/2020 17:40    Procedures Procedures   Medications Ordered in ED Medications  sodium chloride 0.9 % bolus 1,000 mL (0 mLs Intravenous Stopped 12/10/20 1846)  metoCLOPramide (REGLAN) injection 10 mg (10 mg Intravenous Given 12/10/20 1740)    ED Course  I have reviewed the triage vital signs and the nursing notes.  Pertinent labs & imaging results that were available during my care of the patient were reviewed by me and considered in my medical decision making (see chart for details).  Clinical Course as of 12/10/20 1913  Nancy Fetter Dec 10, 2020  1844 Hemoglobin(!): 15.2 [HK]  1904 WBC: 4.1 [HK]  1905 Creatinine: 0.79 [HK]  1905 Sodium(!): 132 [HK]  1905 HCG, Beta Chain, Quant, S: <1 [HK]    Clinical Course User Index [HK] Delia Heady, PA-C   MDM Rules/Calculators/A&P                           25 year old female presenting to the ED for emesis, diarrhea, cough, congestion, shortness of breath, myalgias.  Sent home with the flu.  She is concerned for the same.  Last antipyretic use was 2 days ago.  On exam abdomen is soft.  Lungs are clear to auscultation bilaterally.  She is slightly tachycardic which could be due to dehydration.  We will check labs and provide IV hydration medication.  Lab work with unremarkable CMP, CBC  and lipase.  hCG is negative.  Chest x-ray without any acute findings.  Her respiratory panel is pending.  On recheck patient reports significant improvement in her symptoms.  States that she feels more hydrated.  She is able to tolerate p.o. intake without difficulty.  It could be that her symptoms are due to influenza due to her close sick contact or other viral illness.  However the respiratory panel will result and will have her follow-up on these results.  In the meantime we will treat symptomatically at home and have her increase her hydration.  Return precautions given.    Patient is hemodynamically stable, in NAD, and able to ambulate in the ED. Evaluation does not show pathology that would require ongoing emergent intervention or inpatient treatment. I explained the diagnosis to the patient. Pain has been managed and has no complaints prior to discharge. Patient is comfortable with above plan and is stable for discharge at this time. All questions were answered prior to disposition. Strict return precautions for returning to the ED were discussed. Encouraged follow up with PCP.   An After Visit Summary was printed and given to the patient.   Portions of this note were generated with [employer]. Dictation errors may occur despite best attempts at proofreading.  Final Clinical Impression(s) / ED Diagnoses Final diagnoses:  Dehydration  Viral URI    Rx / DC Orders ED Discharge Orders          Ordered    ondansetron (ZOFRAN ODT) 4 MG disintegrating tablet  Every 8 hours PRN  12/10/20 1913    fluticasone (FLONASE) 50 MCG/ACT nasal spray  Daily        12/10/20 1913    benzonatate (TESSALON) 100 MG capsule  Every 8 hours        12/10/20 1913             Dietrich Pates, Cordelia Poche 12/10/20 1914    Milagros Loll, MD 12/10/20 [phone]

## 2020-12-10 NOTE — Discharge Instructions (Addendum)
Take the medications as needed to help with your symptoms. Follow-up with your primary care provider. We will contact you with the results of your respiratory panel if anything is positive and you can also check on MyChart. Return to the ER if you start to experience worsening symptoms, continued vomiting, chest pain, shortness of breath.

## 2020-12-11 LAB — RESP PANEL BY RT-PCR (FLU A&B, COVID) ARPGX2
Influenza A by PCR: POSITIVE — AB
Influenza B by PCR: NEGATIVE
SARS Coronavirus 2 by RT PCR: NEGATIVE

## 2021-12-23 ENCOUNTER — Other Ambulatory Visit: Payer: Self-pay

## 2021-12-23 ENCOUNTER — Emergency Department (HOSPITAL_BASED_OUTPATIENT_CLINIC_OR_DEPARTMENT_OTHER)
Admission: EM | Admit: 2021-12-23 | Discharge: 2021-12-23 | Disposition: A | Discharge status: Home / Self Care | Payer: Medicaid Other | Attending: Emergency Medicine | Admitting: Emergency Medicine

## 2021-12-23 ENCOUNTER — Encounter (HOSPITAL_BASED_OUTPATIENT_CLINIC_OR_DEPARTMENT_OTHER): Payer: Self-pay | Admitting: Pediatrics

## 2021-12-23 ENCOUNTER — Emergency Department (HOSPITAL_BASED_OUTPATIENT_CLINIC_OR_DEPARTMENT_OTHER): Payer: Medicaid Other

## 2021-12-23 DIAGNOSIS — K529 Noninfective gastroenteritis and colitis, unspecified: Secondary | ICD-10-CM | POA: Insufficient documentation

## 2021-12-23 DIAGNOSIS — R1030 Lower abdominal pain, unspecified: Secondary | ICD-10-CM

## 2021-12-23 LAB — COMPREHENSIVE METABOLIC PANEL
ALT: 12 U/L (ref 0–44)
AST: 14 U/L — ABNORMAL LOW (ref 15–41)
Albumin: 4.1 g/dL (ref 3.5–5.0)
Alkaline Phosphatase: 62 U/L (ref 38–126)
Anion gap: 6 (ref 5–15)
BUN: 13 mg/dL (ref 6–20)
CO2: 24 mmol/L (ref 22–32)
Calcium: 8.7 mg/dL — ABNORMAL LOW (ref 8.9–10.3)
Chloride: 106 mmol/L (ref 98–111)
Creatinine, Ser: 0.85 mg/dL (ref 0.44–1.00)
GFR, Estimated: >60 mL/min (ref >60)
Glucose, Bld: 90 mg/dL (ref 70–99)
Potassium: 4 mmol/L (ref 3.5–5.1)
Sodium: 136 mmol/L (ref 135–145)
Total Bilirubin: 0.4 mg/dL (ref 0.3–1.2)
Total Protein: 8.4 g/dL — ABNORMAL HIGH (ref 6.5–8.1)

## 2021-12-23 LAB — CBC
HCT: 43.7 % (ref 36.0–46.0)
Hemoglobin: 14.4 g/dL (ref 12.0–15.0)
MCH: 28.5 pg (ref 26.0–34.0)
MCHC: 33 g/dL (ref 30.0–36.0)
MCV: 86.4 fL (ref 80.0–100.0)
Platelets: 280 10*3/uL (ref 150–400)
RBC: 5.06 MIL/uL (ref 3.87–5.11)
RDW: 14.2 % (ref 11.5–15.5)
WBC: 6.8 10*3/uL (ref 4.0–10.5)
nRBC: 0 % (ref 0.0–0.2)

## 2021-12-23 LAB — URINALYSIS, ROUTINE W REFLEX MICROSCOPIC
Bilirubin Urine: NEGATIVE
Glucose, UA: NEGATIVE mg/dL
Hgb urine dipstick: NEGATIVE
Ketones, ur: NEGATIVE mg/dL
Leukocytes,Ua: NEGATIVE
Nitrite: NEGATIVE
Protein, ur: NEGATIVE mg/dL
Specific Gravity, Urine: >=1.03 (ref 1.005–1.030)
pH: 6 (ref 5.0–8.0)

## 2021-12-23 LAB — LIPASE, BLOOD: Lipase: 34 U/L (ref 11–51)

## 2021-12-23 LAB — PREGNANCY, URINE: Preg Test, Ur: NEGATIVE

## 2021-12-23 MED ORDER — MORPHINE SULFATE (PF) 2 MG/ML IV SOLN
2.0000 mg | Freq: Once | INTRAVENOUS | Status: AC
  Administered 2021-12-23: 2 mg via INTRAVENOUS
  Filled 2021-12-23: qty 1

## 2021-12-23 MED ORDER — IOHEXOL 300 MG/ML  SOLN
100.0000 mL | Freq: Once | INTRAMUSCULAR | Status: AC | PRN
  Administered 2021-12-23: 100 mL via INTRAVENOUS

## 2021-12-23 MED ORDER — SODIUM CHLORIDE 0.9 % IV BOLUS
1000.0000 mL | Freq: Once | INTRAVENOUS | Status: AC
  Administered 2021-12-23: 1000 mL via INTRAVENOUS

## 2021-12-23 MED ORDER — KETOROLAC TROMETHAMINE 15 MG/ML IJ SOLN
15.0000 mg | Freq: Once | INTRAMUSCULAR | Status: AC
  Administered 2021-12-23: 15 mg via INTRAVENOUS
  Filled 2021-12-23: qty 1

## 2021-12-23 NOTE — ED Triage Notes (Signed)
C/o lower abdominal pain x 3 days; denies NV; denies PMH.

## 2021-12-23 NOTE — ED Provider Notes (Signed)
[facility] EMERGENCY DEPARTMENT Provider Note   CSN: [id number] Arrival date & time: 12/23/21  1438     History  Chief Complaint  Patient presents with   Abdominal Pain    Alicia Simon is a 26 y.o. female with noncontributory past medical history who presents with concern for severe abdominal pain for the last 2 days.  She reports that it began 2 nights ago, she was able to tolerate enough to go to sleep, hoping that it resolved when she woke up in the morning.  Patient reports that she had pain all day yesterday, did not leave the bed, she reports decreased appetite but denies nausea, vomiting, diarrhea, constipation, dysuria, hematuria.  She denies previous history of intra-abdominal surgery.   Abdominal Pain      Home Medications Prior to Admission medications   Medication Sig Start Date End Date Taking? Authorizing Provider  benzonatate (TESSALON) 100 MG capsule Take 1 capsule (100 mg total) by mouth every 8 (eight) hours. 12/10/20   Khatri, Hina, PA-C  cyclobenzaprine (FLEXERIL) 10 MG tablet Take 1 tablet (10 mg total) by mouth 2 (two) times daily as needed for muscle spasms. 11/03/18   Robinson, [place] N, PA-C  fluticasone (FLONASE) 50 MCG/ACT nasal spray Place 1 spray into both nostrils daily. 12/10/20   Khatri, Hina, PA-C  HYDROcodone-acetaminophen (NORCO) 5-325 MG tablet Take 1 tablet by mouth every 4 (four) hours as needed. Patient not taking: Reported on 11/03/2018 04/14/16   Mancel Bale, MD  ibuprofen (ADVIL,MOTRIN) 600 MG tablet Take 1 tablet (600 mg total) by mouth every 6 (six) hours as needed. Patient not taking: Reported on 11/03/2018 02/27/16   Katrinka Blazing, [place], CNM  naproxen (NAPROSYN) 500 MG tablet Take 1 tablet (500 mg total) by mouth 2 (two) times daily. 09/19/20   Jeannie Fend, PA-C  naproxen sodium (ANAPROX) 220 MG tablet Take 220 mg by mouth 2 (two) times daily as needed (pain, headache).    [provider]  ondansetron (ZOFRAN ODT) 4  MG disintegrating tablet Take 1 tablet (4 mg total) by mouth every 8 (eight) hours as needed for nausea or vomiting. 12/10/20   Khatri, Hina, PA-C  phenazopyridine (PYRIDIUM) 200 MG tablet TAKE 1 TABLET(200 MG) BY MOUTH THREE TIMES DAILY AS NEEDED FOR PAIN Patient not taking: Reported on 04/14/2016 02/27/16   [name], CNM      Allergies    Patient has no known allergies.    Review of Systems   Review of Systems  Gastrointestinal:  Positive for abdominal pain.  All other systems reviewed and are negative.   Physical Exam Updated Vital Signs BP (!) 167/155   Pulse 79   Temp 99.1 F (37.3 C)   Resp 16   Ht 5\' 2"  (1.575 m)   Wt 117.9 kg   LMP 11/22/2021 (Approximate)   SpO2 100%   BMI 47.55 kg/m  Physical Exam Vitals and nursing note reviewed.  Constitutional:      General: She is not in acute distress.    Appearance: Normal appearance.  HENT:     Head: Normocephalic and atraumatic.  Eyes:     General:        Right eye: No discharge.        Left eye: No discharge.  Cardiovascular:     Rate and Rhythm: Normal rate and regular rhythm.     Heart sounds: No murmur heard.    No friction rub. No gallop.  Pulmonary:     Effort:  Pulmonary effort is normal.     Breath sounds: Normal breath sounds.  Abdominal:     General: Bowel sounds are normal.     Palpations: Abdomen is soft.     Comments: Patient most focally tender suprapubic, epigastric region radiating towards the right lower quadrant, no rebound, rigidity, guarding throughout, normal bowel sounds throughout.  Skin:    General: Skin is warm and dry.     Capillary Refill: Capillary refill takes less than 2 seconds.  Neurological:     Mental Status: She is alert and oriented to person, place, and time.  Psychiatric:        Mood and Affect: Mood normal.        Behavior: Behavior normal.     ED Results / Procedures / Treatments   Labs (all labs ordered are listed, but only abnormal results are displayed) Labs  Reviewed  COMPREHENSIVE METABOLIC PANEL - Abnormal; Notable for the following components:      Result Value   Calcium 8.7 (*)    Total Protein 8.4 (*)    AST 14 (*)    All other components within normal limits  LIPASE, BLOOD  CBC  URINALYSIS, ROUTINE W REFLEX MICROSCOPIC  PREGNANCY, URINE    EKG None  Radiology CT ABDOMEN PELVIS W CONTRAST  Result Date: 12/23/2021 CLINICAL DATA:  Right lower quadrant abdominal pain EXAM: CT ABDOMEN AND PELVIS WITH CONTRAST TECHNIQUE: Multidetector CT imaging of the abdomen and pelvis was performed using the standard protocol following bolus administration of intravenous contrast. RADIATION DOSE REDUCTION: This exam was performed according to the departmental dose-optimization program which includes automated exposure control, adjustment of the mA and/or kV according to patient size and/or use of iterative reconstruction technique. CONTRAST:  OMNIPAQUE IOHEXOL 300 MG/ML  SOLN COMPARISON:  None Available. FINDINGS: Lower chest: No acute abnormality. HEPATOBILIARY: THERE ARE MULTIPLE SCATTERED LOW-ATTENUATION LIVER LESIONS.  THESE INCLUDE: HEPATOBILIARY: THERE ARE MULTIPLE SCATTERED LOW-ATTENUATION LIVER LESIONS. THESE INCLUDE -1.1 cm low-density structure within right lobe measuring 1.1 cm, image 27/2. -Focal area of low attenuation along the falciform ligament within medial segment of left lobe measures 1.3 cm , image 32/2. -Along the posterior dome of right lobe of liver there is a indeterminate low-density structure measuring 2.3 cm. -The largest lesion is in the left hepatic lobe measuring 4.9 x 4.3 cm, image 25/2. This has imaging characteristics compatible with a benign hemangioma. Gallbladder is unremarkable.  No signs of bile duct dilatation. Pancreas: Unremarkable. No pancreatic ductal dilatation or surrounding inflammatory changes. Spleen: Normal in size without focal abnormality. Calcified granulomas noted. Adrenals/Urinary Tract: Normal adrenal  glands. No kidney mass, nephrolithiasis, or hydronephrosis. Urinary bladder is unremarkable. Stomach/Bowel: Stomach appears normal. The appendix is visualized and is within normal limits. Partially decompressed descending colon and sigmoid colon is identified with mild diffuse wall thickening. No surrounding inflammatory fat stranding or free fluid. The remaining bowel loops are unremarkable Vascular/Lymphatic: No significant vascular findings identified. No enlarged abdominopelvic lymph nodes. Reproductive: Uterus appears within normal limits. Left ovary appears normal. Right ovary corpus luteal cyst is identified measuring 2 cm. Other: No significant free fluid.  No fluid collections. Musculoskeletal: No acute or significant osseous findings. IMPRESSION: 1. Partially decompressed descending colon and sigmoid colon is identified with mild diffuse wall thickening. No surrounding inflammatory fat stranding or free fluid. Correlate for any clinical signs or symptoms of colitis. 2. Multiple low-attenuation liver lesions are identified. The largest lesion is in the left lobe of liver and has imaging  characteristics compatible with a benign hemangioma. The other lesions are indeterminate. In the absence of a known malignancy these also likely represent a benign process. Consider more definitive characterization with nonemergent, outpatient contrast enhanced MRI of the liver. 3. Right ovary corpus luteal cyst is identified measuring 2 cm. Electronically Signed   By: Signa Kell M.D.   On: 12/23/2021 16:59    Procedures Procedures    Medications Ordered in ED Medications  morphine (PF) 2 MG/ML injection 2 mg (2 mg Intravenous Given 12/23/21 1525)  ketorolac (TORADOL) 15 MG/ML injection 15 mg (15 mg Intravenous Given 12/23/21 1524)  sodium chloride 0.9 % bolus 1,000 mL (0 mLs Intravenous Stopping previously hung infusion 12/23/21 1647)  iohexol (OMNIPAQUE) 300 MG/ML solution 100 mL (100 mLs Intravenous  Contrast Given 12/23/21 1636)    ED Course/ Medical Decision Making/ A&P                           Medical Decision Making Amount and/or Complexity of Data Reviewed Labs: ordered.   This patient is a 26 y.o. female who presents to the ED for concern of abdominal pain, this involves an extensive number of treatment options, and is a complaint that carries with it a high risk of complications and morbidity. The emergent differential diagnosis prior to evaluation includes, but is not limited to, GERD, acid reflux, colitis, upper or lower GI bacterial versus viral infection, less clinical concern for acute diverticulitis, pyelonephritis, nephrolithiasis, appendicitis, also considered UTI, pyelonephritis, PID, TOA, or other pelvic abnormality.   This is not an exhaustive differential.   Past Medical History / Co-morbidities / Social History: Hypertension, obesity  Physical Exam: Physical exam performed. The pertinent findings include: Patient with some diffuse lower abdominal tenderness without rebound, rigidity, guarding she is normal bowel sounds throughout.  She arrives minimally hyper tensive with blood pressure 141/88, with I believe is a spurious reading prior to discharge of 167/155 as this narrow pulse pressure is not consistent with normal cardiac function.  Nonetheless encouraged her to follow-up regarding high blood pressure as she has a previous diagnosis, but may need more aggressive management.  She is tachycardic on arrival with a pulse of 117, likely secondary to pain from mild dehydration, with improvement 79 prior to discharge.  Otherwise stable vital signs, minimally elevated temperature of 99.1 on arrival.  Lab Tests: I ordered, and personally interpreted labs.  The pertinent results include: UA is unremarkable, CBC is unremarkable, CMP is notable for very mild hypocalcemia, calcium 8.7, lipase normal, pregnancy test negative.   Imaging Studies: I ordered imaging studies  including CT abdomen pelvis with contrast. I independently visualized and interpreted imaging which showed some lower intestinal inflammation suggestive of colitis without any perforation, evidence of acute diverticulitis or other surgical findings. I agree with the radiologist interpretation.   Medications: I ordered medication including Toradol, morphine, fluid bolus for pain, mild dehydration, tachycardia. Reevaluation of the patient after these medicines showed that the patient improved. I have reviewed the patients home medicines and have made adjustments as needed.   Disposition: After consideration of the diagnostic results and the patients response to treatment, I feel that patient symptoms are consistent with mild colitis either from dietary choices or mild viral infection versus other.  She does not have a leukocytosis so I have very low clinical suspicion for any bacterial infection.  Encouraged bland diet, and return to normal eating as tolerated, can use Pepto-Bismol or other over-the-counter  stomach pain medications as needed for symptoms, encouraged ibuprofen, Tylenol for symptom management.   emergency department workup does not suggest an emergent condition requiring admission or immediate intervention beyond what has been performed at this time. The plan is: as above. The patient is safe for discharge and has been instructed to return immediately for worsening symptoms, change in symptoms or any other concerns.  I discussed this case with my attending physician Dr. Dalene SeltzerSchlossman who cosigned this note including patient's presenting symptoms, physical exam, and planned diagnostics and interventions. Attending physician stated agreement with plan or made changes to plan which were implemented.    Final Clinical Impression(s) / ED Diagnoses Final diagnoses:  Lower abdominal pain  Colitis    Rx / DC Orders ED Discharge Orders     None         Olene Flossrosperi, [place] Gladson H,  PA-C 12/23/21 1809    Alvira MondaySchlossman, Erin, MD 12/24/21 1320

## 2022-05-28 ENCOUNTER — Encounter: Payer: Self-pay | Admitting: *Deleted

## 2022-08-23 IMAGING — CR DG KNEE COMPLETE 4+V*L*
4 series · 4 of 4 positions shown · non-contrast
Comparison: None.

CLINICAL DATA: Injury with knee pain.

EXAM:
LEFT KNEE - COMPLETE 4+ VIEW

[t knee oblique left (1 of 2)]
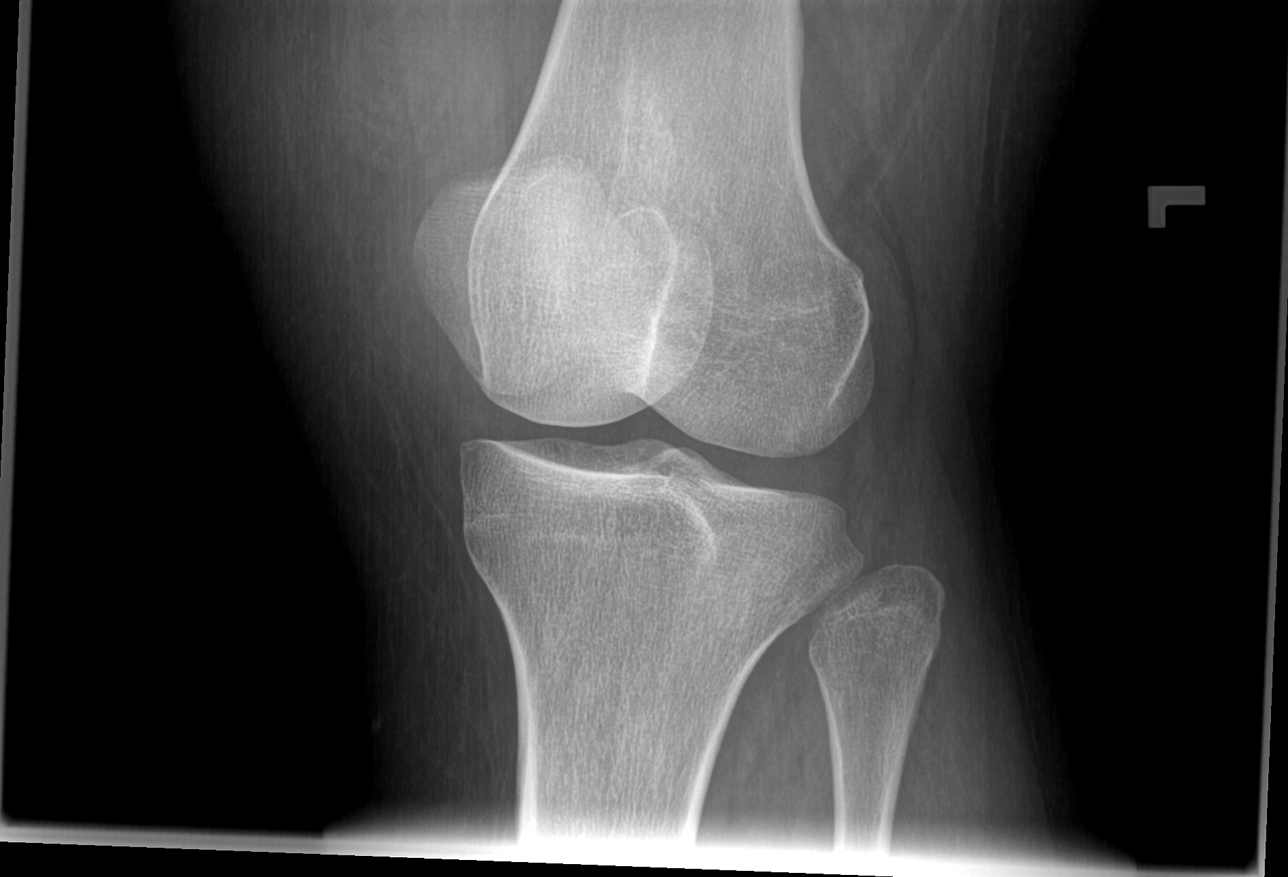

[t knee oblique left (2 of 2)]
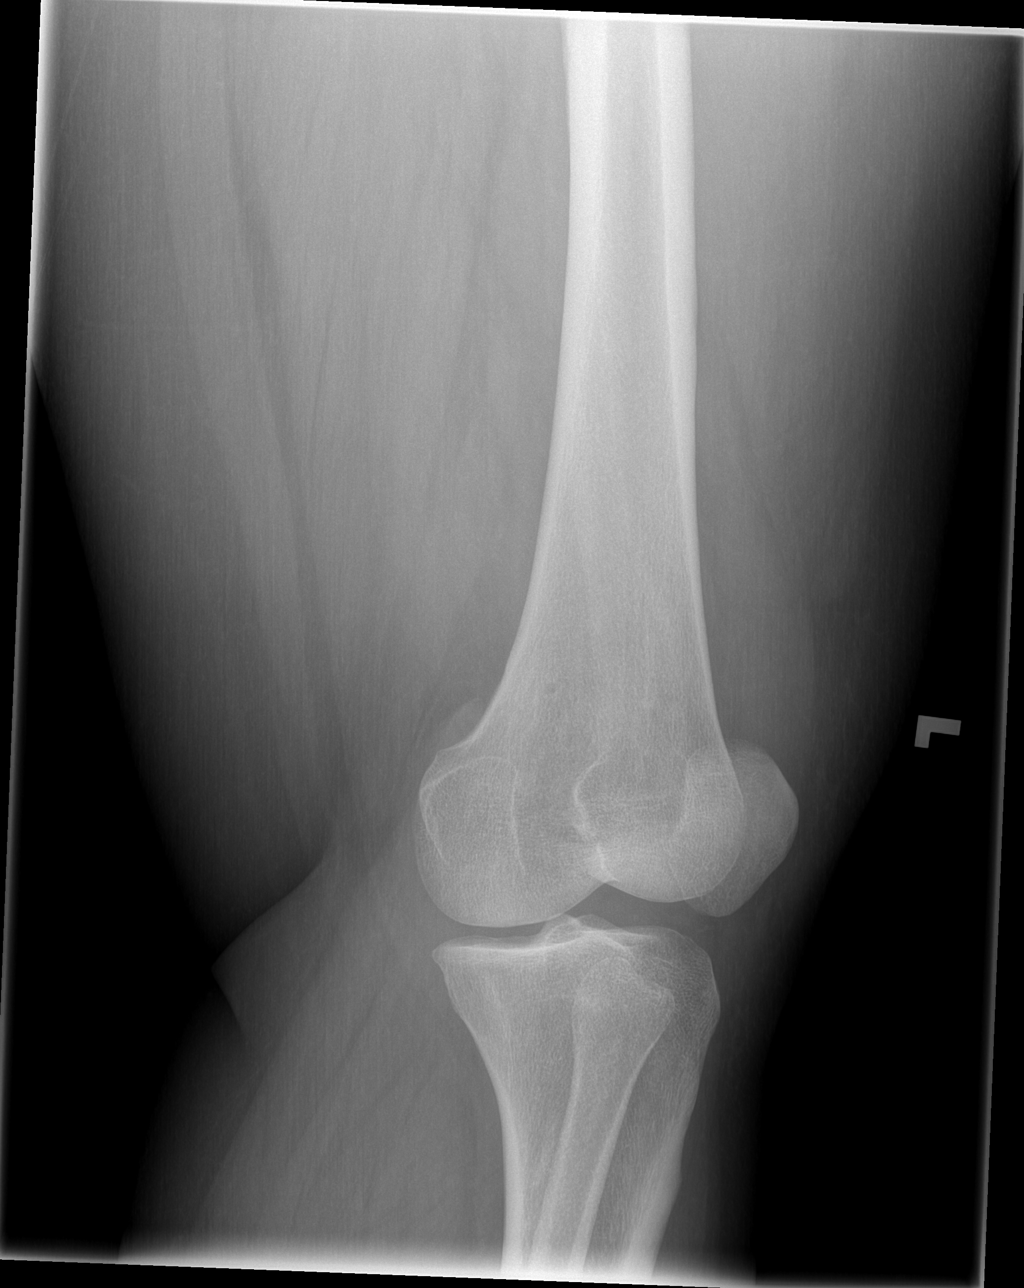

[t knee ap left]
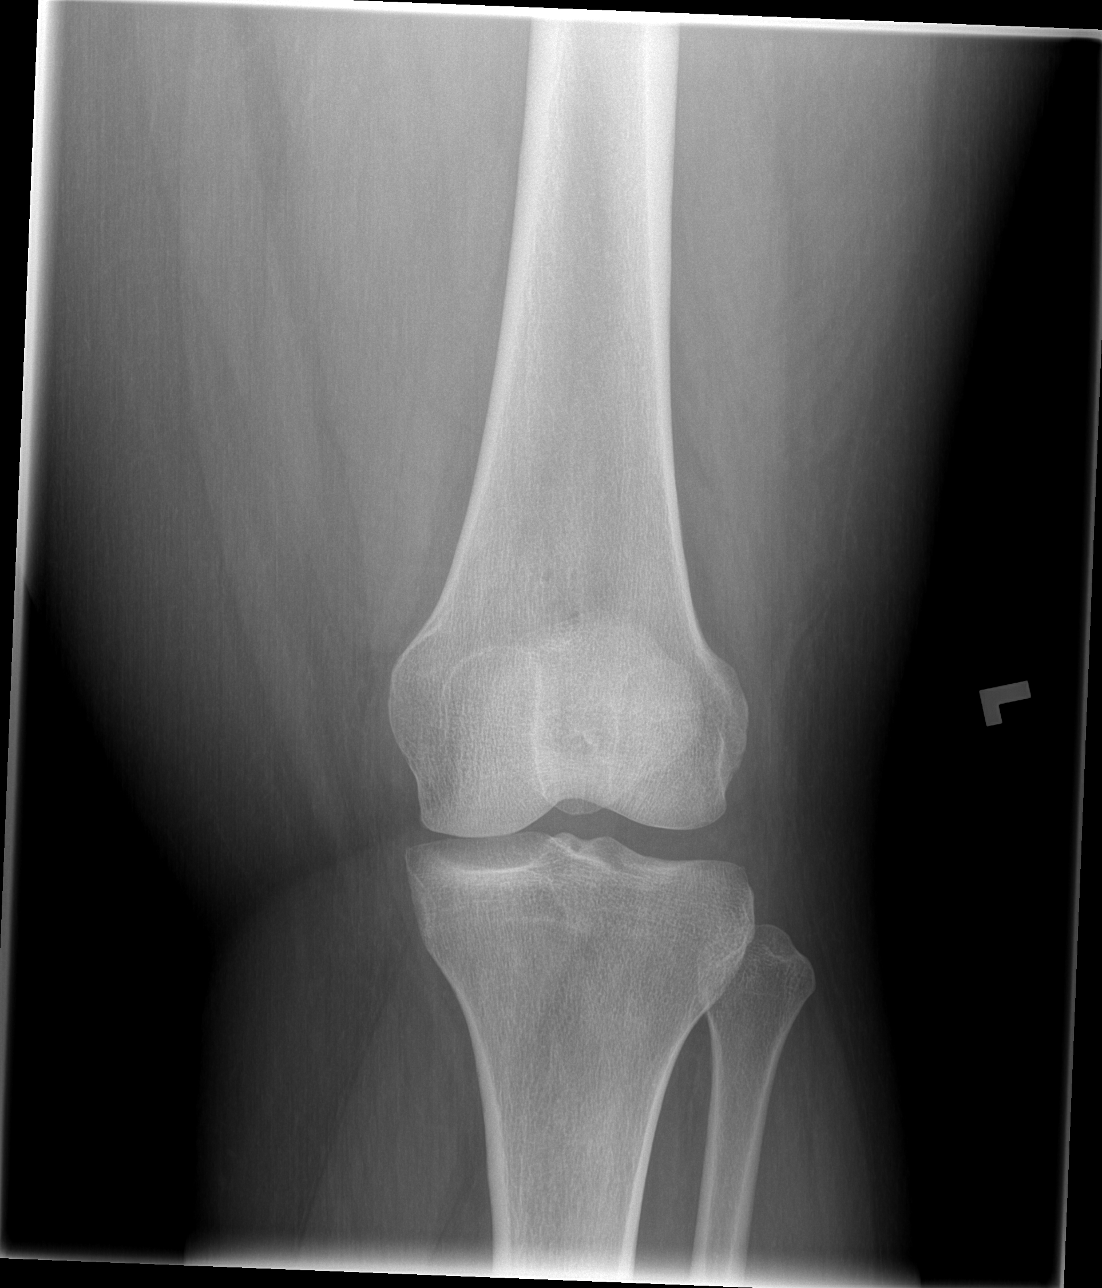

[t knee lat left]
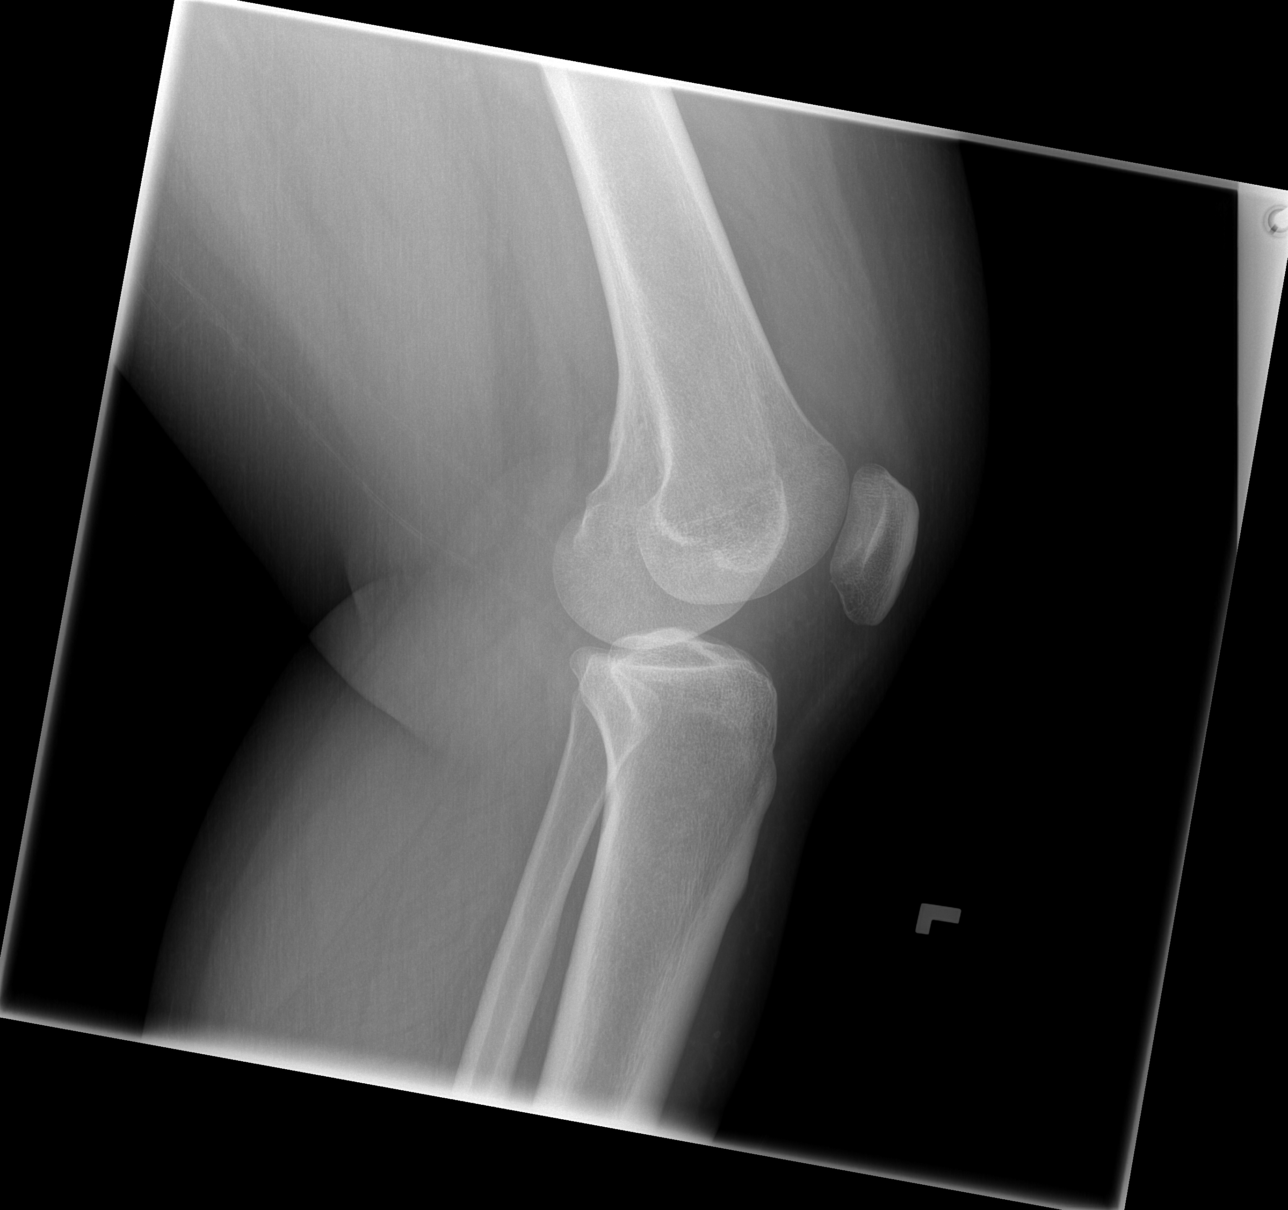

[4 of 4 positions shown; findings below may reference images not displayed]

FINDINGS: No evidence of fracture, dislocation, or joint effusion. No evidence
of arthropathy or other focal bone abnormality. Soft tissues are
unremarkable.
IMPRESSION: Negative.

## 2022-10-04 IMAGING — US US PELVIS COMPLETE WITH TRANSVAGINAL
2 series · 13 of 25 positions shown · non-contrast
Comparison: None

CLINICAL DATA: Dyspareunia, history of difficult IUD removal and
PID; LMP 10/07/2020



[Series 1: us pelvis complete with transvaginal · 0.23mm/px · 6 of 53 slices shown (1 of 2)]
[im 1/53]
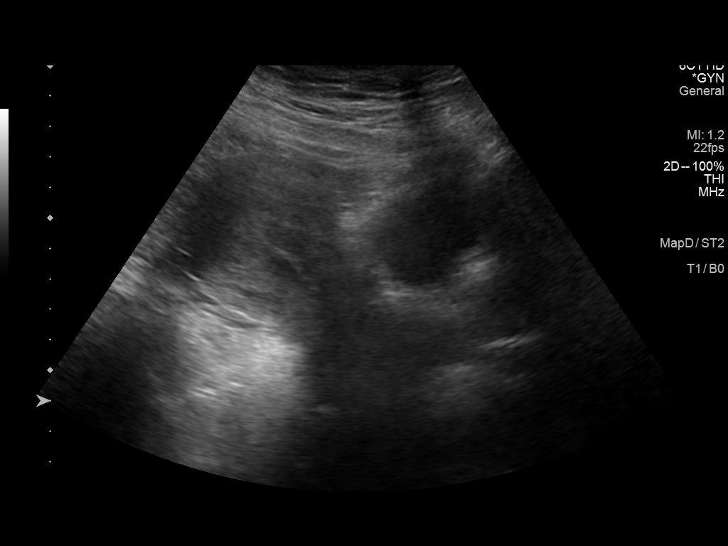
[im 11/53]
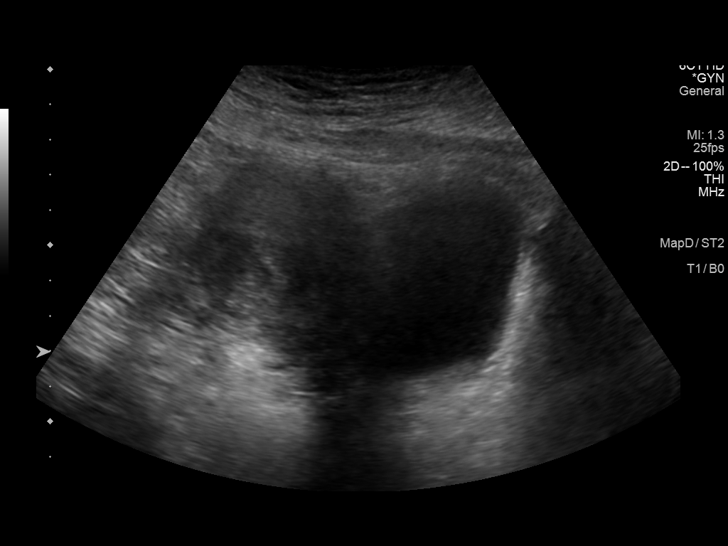
[im 21/53]
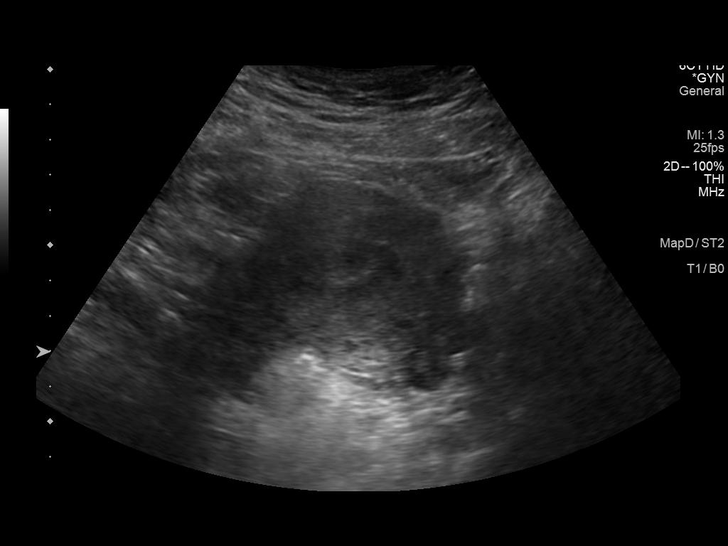
[im 32/53]
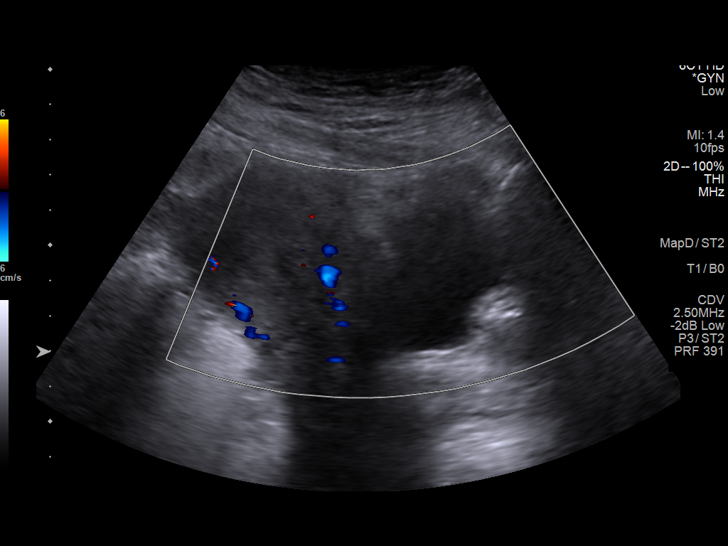
[im 42/53]
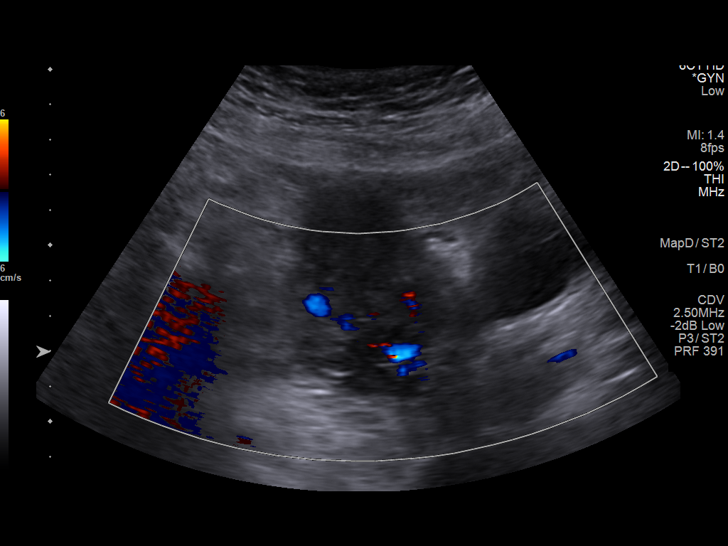
[im 53/53]
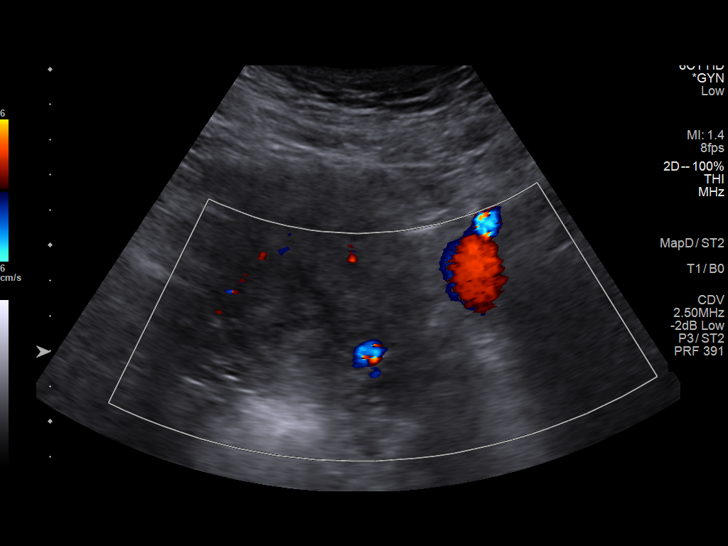

[Series 1001: us pelvis complete with transvaginal · 0.11mm/px · 7 of 73 slices shown (2 of 2)]
[im 6/73]
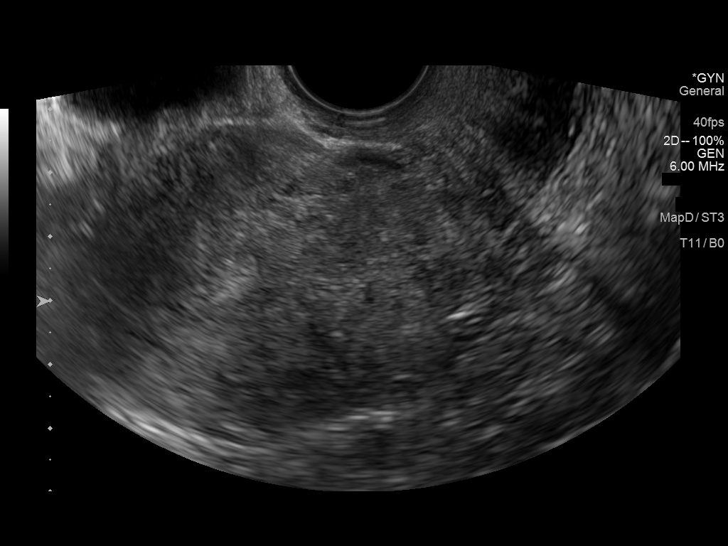
[im 17/73]
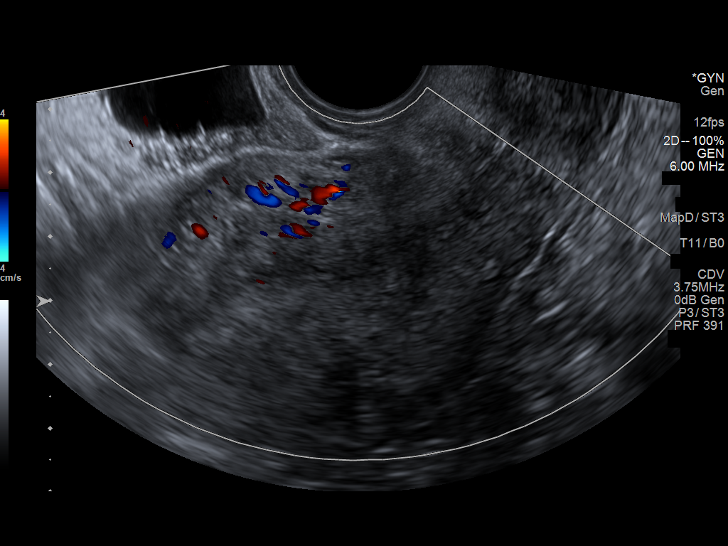
[im 28/73]
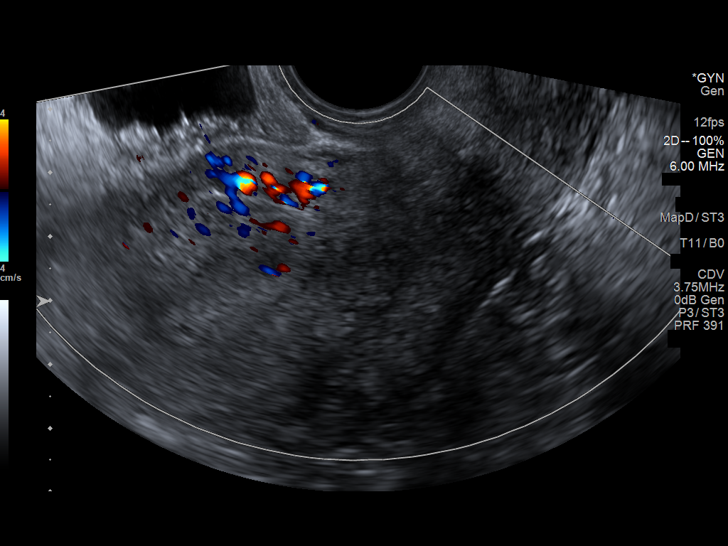
[im 39/73]
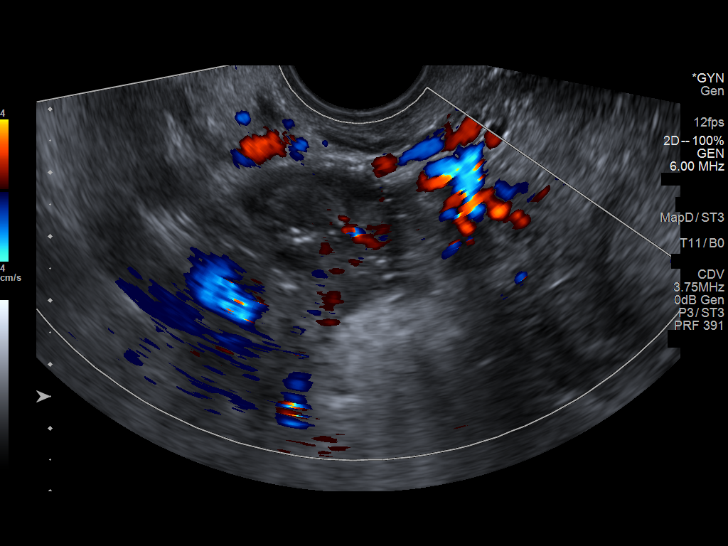
[im 50/73]
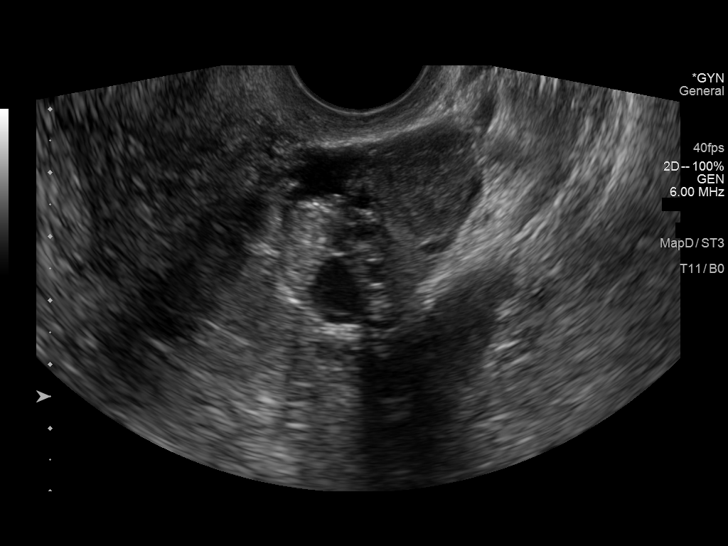
[im 61/73]
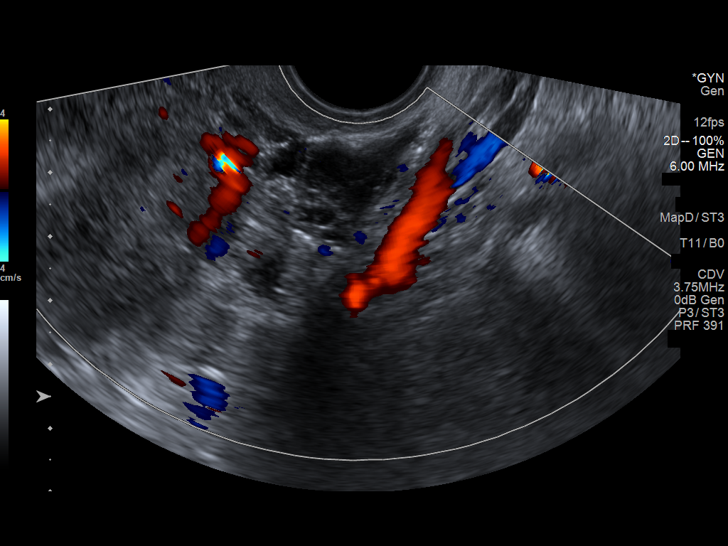
[im 73/73]
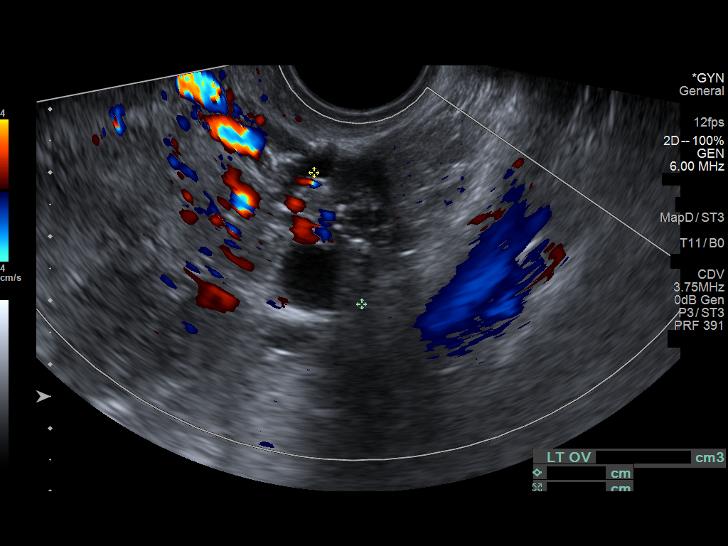

[13 of 25 positions shown; findings below may reference images not displayed]

FINDINGS: Uterus

Measurements: 9.8 x 5.2 x 6.0 cm = volume: 159 mL. Heterogeneous
myometrium. Small anterior wall intramural leiomyoma 11 mm diameter.
Questionable posterior upper uterine leiomyoma 3.3 x 2.2 x 2.6 cm,
versus heterogeneous myometrium. No additional masses.

Endometrium

Thickness: 9 mm.  No endometrial fluid or focal abnormality

Right ovary

Measurements: 3.1 x 2.3 x 2.6 cm = volume: 9.4 mL. Normal morphology
without mass

Left ovary

Measurements: 4.7 x 1.6 x 2.7 cm = volume: 11 mL. Small resolving
corpus luteum without additional mass

Other findings

Trace free pelvic fluid.  No adnexal masses.
IMPRESSION: Question 2 small uterine leiomyomata as above.

Remainder of exam unremarkable.

## 2022-11-02 ENCOUNTER — Encounter (HOSPITAL_BASED_OUTPATIENT_CLINIC_OR_DEPARTMENT_OTHER): Payer: Self-pay | Admitting: Emergency Medicine

## 2022-11-02 ENCOUNTER — Emergency Department (HOSPITAL_BASED_OUTPATIENT_CLINIC_OR_DEPARTMENT_OTHER)
Admission: EM | Admit: 2022-11-02 | Discharge: 2022-11-02 | Disposition: A | Discharge status: Home / Self Care | Payer: Medicaid Other | Attending: Emergency Medicine | Admitting: Emergency Medicine

## 2022-11-02 ENCOUNTER — Emergency Department (HOSPITAL_BASED_OUTPATIENT_CLINIC_OR_DEPARTMENT_OTHER): Payer: Medicaid Other

## 2022-11-02 ENCOUNTER — Other Ambulatory Visit: Payer: Self-pay

## 2022-11-02 DIAGNOSIS — Z20822 Contact with and (suspected) exposure to covid-19: Secondary | ICD-10-CM | POA: Insufficient documentation

## 2022-11-02 DIAGNOSIS — J029 Acute pharyngitis, unspecified: Secondary | ICD-10-CM | POA: Diagnosis not present

## 2022-11-02 DIAGNOSIS — N10 Acute tubulo-interstitial nephritis: Secondary | ICD-10-CM | POA: Diagnosis not present

## 2022-11-02 DIAGNOSIS — I10 Essential (primary) hypertension: Secondary | ICD-10-CM | POA: Diagnosis not present

## 2022-11-02 DIAGNOSIS — R109 Unspecified abdominal pain: Secondary | ICD-10-CM | POA: Diagnosis present

## 2022-11-02 LAB — COMPREHENSIVE METABOLIC PANEL
ALT: 15 U/L (ref 0–44)
AST: 15 U/L (ref 15–41)
Albumin: 4.1 g/dL (ref 3.5–5.0)
Alkaline Phosphatase: 69 U/L (ref 38–126)
Anion gap: 15 (ref 5–15)
BUN: 11 mg/dL (ref 6–20)
CO2: 21 mmol/L — ABNORMAL LOW (ref 22–32)
Calcium: 8.9 mg/dL (ref 8.9–10.3)
Chloride: 99 mmol/L (ref 98–111)
Creatinine, Ser: 0.85 mg/dL (ref 0.44–1.00)
GFR, Estimated: >60 mL/min (ref >60)
Glucose, Bld: 130 mg/dL — ABNORMAL HIGH (ref 70–99)
Potassium: 3.5 mmol/L (ref 3.5–5.1)
Sodium: 135 mmol/L (ref 135–145)
Total Bilirubin: 0.7 mg/dL (ref 0.3–1.2)
Total Protein: 9.3 g/dL — ABNORMAL HIGH (ref 6.5–8.1)

## 2022-11-02 LAB — CBC WITH DIFFERENTIAL/PLATELET
Abs Immature Granulocytes: 0.03 10*3/uL (ref 0.00–0.07)
Basophils Absolute: 0 10*3/uL (ref 0.0–0.1)
Basophils Relative: 0 %
Eosinophils Absolute: 0 10*3/uL (ref 0.0–0.5)
Eosinophils Relative: 0 %
HCT: 43.4 % (ref 36.0–46.0)
Hemoglobin: 14.1 g/dL (ref 12.0–15.0)
Immature Granulocytes: 0 %
Lymphocytes Relative: 12 %
Lymphs Abs: 1.5 10*3/uL (ref 0.7–4.0)
MCH: 27.8 pg (ref 26.0–34.0)
MCHC: 32.5 g/dL (ref 30.0–36.0)
MCV: 85.4 fL (ref 80.0–100.0)
Monocytes Absolute: 1.4 10*3/uL — ABNORMAL HIGH (ref 0.1–1.0)
Monocytes Relative: 11 %
Neutro Abs: 10 10*3/uL — ABNORMAL HIGH (ref 1.7–7.7)
Neutrophils Relative %: 77 %
Platelets: 291 10*3/uL (ref 150–400)
RBC: 5.08 MIL/uL (ref 3.87–5.11)
RDW: 14.1 % (ref 11.5–15.5)
WBC: 13 10*3/uL — ABNORMAL HIGH (ref 4.0–10.5)
nRBC: 0 % (ref 0.0–0.2)

## 2022-11-02 LAB — URINALYSIS, ROUTINE W REFLEX MICROSCOPIC
Glucose, UA: NEGATIVE mg/dL
Ketones, ur: 15 mg/dL — AB
Leukocytes,Ua: NEGATIVE
Nitrite: POSITIVE — AB
Protein, ur: >=300 mg/dL — AB
Specific Gravity, Urine: >=1.03 (ref 1.005–1.030)
pH: 6 (ref 5.0–8.0)

## 2022-11-02 LAB — RESP PANEL BY RT-PCR (RSV, FLU A&B, COVID)  RVPGX2
Influenza A by PCR: NEGATIVE
Influenza B by PCR: NEGATIVE
Resp Syncytial Virus by PCR: NEGATIVE
SARS Coronavirus 2 by RT PCR: NEGATIVE

## 2022-11-02 LAB — URINALYSIS, MICROSCOPIC (REFLEX)

## 2022-11-02 LAB — PREGNANCY, URINE: Preg Test, Ur: NEGATIVE

## 2022-11-02 LAB — LIPASE, BLOOD: Lipase: 26 U/L (ref 11–51)

## 2022-11-02 LAB — GROUP A STREP BY PCR: Group A Strep by PCR: NOT DETECTED

## 2022-11-02 LAB — LACTIC ACID, PLASMA: Lactic Acid, Venous: 0.9 mmol/L (ref 0.5–1.9)

## 2022-11-02 MED ORDER — CEPHALEXIN 500 MG PO CAPS
1000.0000 mg | ORAL_CAPSULE | Freq: Two times a day (BID) | ORAL | 0 refills | Status: AC
Start: 2022-11-02 — End: 2022-11-12

## 2022-11-02 MED ORDER — IOHEXOL 300 MG/ML  SOLN
100.0000 mL | Freq: Once | INTRAMUSCULAR | Status: AC | PRN
  Administered 2022-11-02: 100 mL via INTRAVENOUS

## 2022-11-02 MED ORDER — ACETAMINOPHEN 325 MG PO TABS
650.0000 mg | ORAL_TABLET | Freq: Once | ORAL | Status: AC
  Administered 2022-11-02: 650 mg via ORAL
  Filled 2022-11-02: qty 2

## 2022-11-02 MED ORDER — SODIUM CHLORIDE 0.9 % IV SOLN
1.0000 g | Freq: Once | INTRAVENOUS | Status: AC
  Administered 2022-11-02: 1 g via INTRAVENOUS
  Filled 2022-11-02: qty 10

## 2022-11-02 MED ORDER — LACTATED RINGERS IV BOLUS
1000.0000 mL | Freq: Once | INTRAVENOUS | Status: AC
  Administered 2022-11-02: 1000 mL via INTRAVENOUS

## 2022-11-02 NOTE — ED Notes (Signed)
Pt ambulatory to bathroom, no assistance needed.

## 2022-11-02 NOTE — ED Notes (Signed)
Pt transported to imaging.

## 2022-11-02 NOTE — ED Triage Notes (Signed)
Pt c/o abd pain, diarrhea, sore throat, chills

## 2022-11-02 NOTE — Discharge Instructions (Addendum)
You were seen in the ER today for concerns of abdominal pain. Your labs show findings of a urinary tract infection. You were given a dose of rocephin here in the ER for treatment of this but you have also sent Keflex to continue treating this over the next 10 days. Please take these antibiotics as prescribed. Return to the ER if symptoms are worsening. Otherwise, follow up with your primary care provider for repeat evaluation.

## 2022-11-02 NOTE — ED Provider Notes (Signed)
[place] [facility] Provider Note   CSN: [id number] Arrival date & time: 11/02/22  1456     History Chief Complaint  Patient presents with   Abdominal Pain    Alicia Simon is a 27 y.o. female.  Patient with past history of hypertension presents to the emergency department concerns of abdominal pain, chills, diarrhea and sore throat.  Reports this been ongoing for several days without improvement in symptoms.  Denies any obvious urinary concerns at this time such as dysuria, hematuria, or increased frequency or urgency.  No prior history of kidney stones or kidney infections.   Abdominal Pain      Home Medications Prior to Admission medications   Medication Sig Start Date End Date Taking? Authorizing Provider  cephALEXin (KEFLEX) 500 MG capsule Take 2 capsules (1,000 mg total) by mouth 2 (two) times daily for 10 days. 11/02/22 11/12/22 Yes Smitty Knudsen, PA-C  benzonatate (TESSALON) 100 MG capsule Take 1 capsule (100 mg total) by mouth every 8 (eight) hours. 12/10/20   Khatri, Hina, PA-C  cyclobenzaprine (FLEXERIL) 10 MG tablet Take 1 tablet (10 mg total) by mouth 2 (two) times daily as needed for muscle spasms. 11/03/18   Robinson, [place] N, PA-C  fluticasone (FLONASE) 50 MCG/ACT nasal spray Place 1 spray into both nostrils daily. 12/10/20   Khatri, Hina, PA-C  HYDROcodone-acetaminophen (NORCO) 5-325 MG tablet Take 1 tablet by mouth every 4 (four) hours as needed. Patient not taking: Reported on 11/03/2018 04/14/16   Mancel Bale, MD  ibuprofen (ADVIL,MOTRIN) 600 MG tablet Take 1 tablet (600 mg total) by mouth every 6 (six) hours as needed. Patient not taking: Reported on 11/03/2018 02/27/16   Katrinka Blazing, [place], CNM  naproxen (NAPROSYN) 500 MG tablet Take 1 tablet (500 mg total) by mouth 2 (two) times daily. 09/19/20   Jeannie Fend, PA-C  naproxen sodium (ANAPROX) 220 MG tablet Take 220 mg by mouth 2 (two) times daily as needed (pain, headache).     [provider]  ondansetron (ZOFRAN ODT) 4 MG disintegrating tablet Take 1 tablet (4 mg total) by mouth every 8 (eight) hours as needed for nausea or vomiting. 12/10/20   Khatri, Hina, PA-C  phenazopyridine (PYRIDIUM) 200 MG tablet TAKE 1 TABLET(200 MG) BY MOUTH THREE TIMES DAILY AS NEEDED FOR PAIN Patient not taking: Reported on 04/14/2016 02/27/16   [name], CNM      Allergies    Patient has no known allergies.    Review of Systems   Review of Systems  Gastrointestinal:  Positive for abdominal pain.  All other systems reviewed and are negative.   Physical Exam Updated Vital Signs BP 107/61   Pulse 82   Temp 98.9 F (37.2 C)   Resp 15   Ht 5\' 2"  (1.575 m)   Wt 124.7 kg   LMP 10/31/2022   SpO2 100%   BMI 50.30 kg/m  Physical Exam Vitals and nursing note reviewed.  Constitutional:      General: She is not in acute distress.    Appearance: She is well-developed.  HENT:     Head: Normocephalic and atraumatic.  Eyes:     Conjunctiva/sclera: Conjunctivae normal.  Cardiovascular:     Rate and Rhythm: Normal rate and regular rhythm.     Heart sounds: No murmur heard. Pulmonary:     Effort: Pulmonary effort is normal. No respiratory distress.     Breath sounds: Normal breath sounds.  Abdominal:  Palpations: Abdomen is soft.     Tenderness: There is no abdominal tenderness. There is right CVA tenderness. There is no left CVA tenderness.  Musculoskeletal:        General: No swelling.     Cervical back: Neck supple.  Skin:    General: Skin is warm and dry.     Capillary Refill: Capillary refill takes less than 2 seconds.  Neurological:     Mental Status: She is alert.  Psychiatric:        Mood and Affect: Mood normal.     ED Results / Procedures / Treatments   Labs (all labs ordered are listed, but only abnormal results are displayed) Labs Reviewed  COMPREHENSIVE METABOLIC PANEL - Abnormal; Notable for the following components:      Result  Value   CO2 21 (*)    Glucose, Bld 130 (*)    Total Protein 9.3 (*)    All other components within normal limits  URINALYSIS, ROUTINE W REFLEX MICROSCOPIC - Abnormal; Notable for the following components:   Color, Urine AMBER (*)    Hgb urine dipstick LARGE (*)    Bilirubin Urine SMALL (*)    Ketones, ur 15 (*)    Protein, ur >=300 (*)    Nitrite POSITIVE (*)    All other components within normal limits  CBC WITH DIFFERENTIAL/PLATELET - Abnormal; Notable for the following components:   WBC 13.0 (*)    Neutro Abs 10.0 (*)    Monocytes Absolute 1.4 (*)    All other components within normal limits  URINALYSIS, MICROSCOPIC (REFLEX) - Abnormal; Notable for the following components:   Bacteria, UA MANY (*)    All other components within normal limits  RESP PANEL BY RT-PCR (RSV, FLU A&B, COVID)  RVPGX2  GROUP A STREP BY PCR  LIPASE, BLOOD  PREGNANCY, URINE  LACTIC ACID, PLASMA    EKG None  Radiology CT ABDOMEN PELVIS W CONTRAST  Result Date: 11/02/2022 CLINICAL DATA:  Abdominal pain associated with chills, diarrhea, and sore throat EXAM: CT ABDOMEN AND PELVIS WITH CONTRAST TECHNIQUE: Multidetector CT imaging of the abdomen and pelvis was performed using the standard protocol following bolus administration of intravenous contrast. RADIATION DOSE REDUCTION: This exam was performed according to the departmental dose-optimization program which includes automated exposure control, adjustment of the mA and/or kV according to patient size and/or use of iterative reconstruction technique. CONTRAST:  OMNIPAQUE IOHEXOL 300 MG/ML  SOLN COMPARISON:  CT abdomen and pelvis dated 12/23/2021 FINDINGS: Lower chest: No focal consolidation or pulmonary nodule in the lung bases. No pleural effusion or pneumothorax demonstrated. Partially imaged heart size is normal. Hepatobiliary: Unchanged size of 5.3 x 4.3 cm hypoattenuating focus with nodular peripheral enhancement in segment 2/3 (301:20), likely  hemangioma. Additional hypoattenuating foci, likely hemangiomas, are also unchanged. No intra or extrahepatic biliary ductal dilation. Normal gallbladder. Pancreas: No focal lesions or main ductal dilation. Spleen: Normal in size.  Scattered calcified granulomata. Adrenals/Urinary Tract: No adrenal nodules. No suspicious renal mass, calculi or hydronephrosis. No focal bladder wall thickening. Stomach/Bowel: Normal appearance of the stomach. No evidence of bowel wall thickening, distention, or inflammatory changes. Normal appendix. Vascular/Lymphatic: No significant vascular findings are present. No enlarged abdominal or pelvic lymph nodes. Reproductive: No adnexal masses.  Tampon within the vagina. Other: No free fluid, fluid collection, or free air. Musculoskeletal: No acute or abnormal lytic or blastic osseous lesions. Small fat-containing paraumbilical hernias. IMPRESSION: 1. No acute findings in the abdomen or pelvis. 2.  Unchanged size of 5.3 cm hepatic hypoattenuating focus with nodular peripheral enhancement in segment 2/3, likely hemangioma. Additional hypoattenuating foci, likely hemangiomas, are also unchanged. Electronically Signed   By: Agustin Cree M.D.   On: 11/02/2022 20:29    Procedures Procedures   Medications Ordered in ED Medications  acetaminophen (TYLENOL) tablet 650 mg (650 mg Oral Given 11/02/22 1512)  lactated ringers bolus 1,000 mL (0 mLs Intravenous Stopped 11/02/22 2121)  iohexol (OMNIPAQUE) 300 MG/ML solution 100 mL (100 mLs Intravenous Contrast Given 11/02/22 1944)  cefTRIAXone (ROCEPHIN) 1 g in sodium chloride 0.9 % 100 mL IVPB (0 g Intravenous Stopped 11/02/22 2121)    ED Course/ Medical Decision Making/ A&P                               Medical Decision Making Amount and/or Complexity of Data Reviewed Labs: ordered. Radiology: ordered.  Risk OTC drugs. Prescription drug management.   This patient presents to the ED for concern of abdominal pain.  Differential  diagnosis includes pyelonephritis, UTI, sepsis, pancreatitis, appendicitis   Lab Tests:  I Ordered, and personally interpreted labs.  The pertinent results include: CBC with leukocytosis at 13.0, CMP without acute findings of dehydration and renal function at baseline, UA with obvious signs of infection as there is a large amount of hemoglobin, positive nitrites, and bacteria noted.  Respiratory viral panel negative for COVID-19, influenza A&B, RSV, group A strep negative, lactic acid 0.9   Imaging Studies ordered:  I ordered imaging studies including CT abdomen pelvis I independently visualized and interpreted imaging which showed no acute abdominal pathology to explain current symptoms I agree with the radiologist interpretation   Medicines ordered and prescription drug management:  I ordered medication including Tylenol, fluids for fever, dehydration Reevaluation of the patient after these medicines showed that the patient improved I have reviewed the patients home medicines and have made adjustments as needed   Problem List / ED Course:  Patient presented to the emergency department concerns of abdominal pain.  Also endorsing some associated diarrhea, chills, body aches, and sore throat.  States that symptoms been ongoing for several days without improvement.  Denies any acute urinary findings.  No prior history of kidney stones or frequent UTIs. On examination, abdomen is nontender.  Right-sided CVA tenderness more prominent than left side.  Again no history of kidney stones or prior kidney infections. Lab workup is concerning for infection his white blood count is at 13.0 patient came in febrile, tachycardic, and tachypneic.  Sepsis workup not initiated from triage but added on additional lactic acid for evaluation.  Suspect patient likely has pyelonephritis given findings of infection and urinalysis.  Will initiate fluid management and plan for CT imaging of the abdomen pelvis given  right-sided CVA tenderness.  Will reassess patient shortly for assessment of symptomatic response. CT is reassuring.  No acute findings consistent with pyelonephritis however given symptom presentation and UA findings, will treat suspected pyelonephritis.  1 g of Rocephin will be administered here in the emergency department. Patient responded well to antibiotics here in the ED and appears to be improving.  She feels comfortable and stable for discharge home with outpatient follow-up with primary care provider.  Will send a prescription for Keflex to continue over the next 10 days to address patient's urinary concerns.  Low concern this time for patient's pharyngitis is appears that this is likely viral in nature given patient being negative  on group A strep.  Discussed strict return precautions with patient.  Patient verbalized understanding all return precautions.  Patient discharged home in stable condition.  Final Clinical Impression(s) / ED Diagnoses Final diagnoses:  Acute pyelonephritis  Pharyngitis, unspecified etiology    Rx / DC Orders ED Discharge Orders          Ordered    cephALEXin (KEFLEX) 500 MG capsule  2 times daily        11/02/22 2157              Smitty Knudsen, PA-C 11/02/22 2159    Rolan Bucco, MD 11/02/22 2332

## 2022-11-02 NOTE — ED Notes (Signed)
D/c paperwork reviewed with pt, including prescriptions and follow up care.  All questions and/or concerns addressed at time of d/c.  No further needs expressed. . Pt verbalized understanding, Ambulatory with family to ED exit, NAD.

## 2022-11-13 IMAGING — DX DG CHEST 1V PORT
1 series · 1 of 1 positions shown · non-contrast
Comparison: December 16, 2017.

CLINICAL DATA: Cough, shortness of breath.

EXAM:
PORTABLE CHEST 1 VIEW

[chest ap]
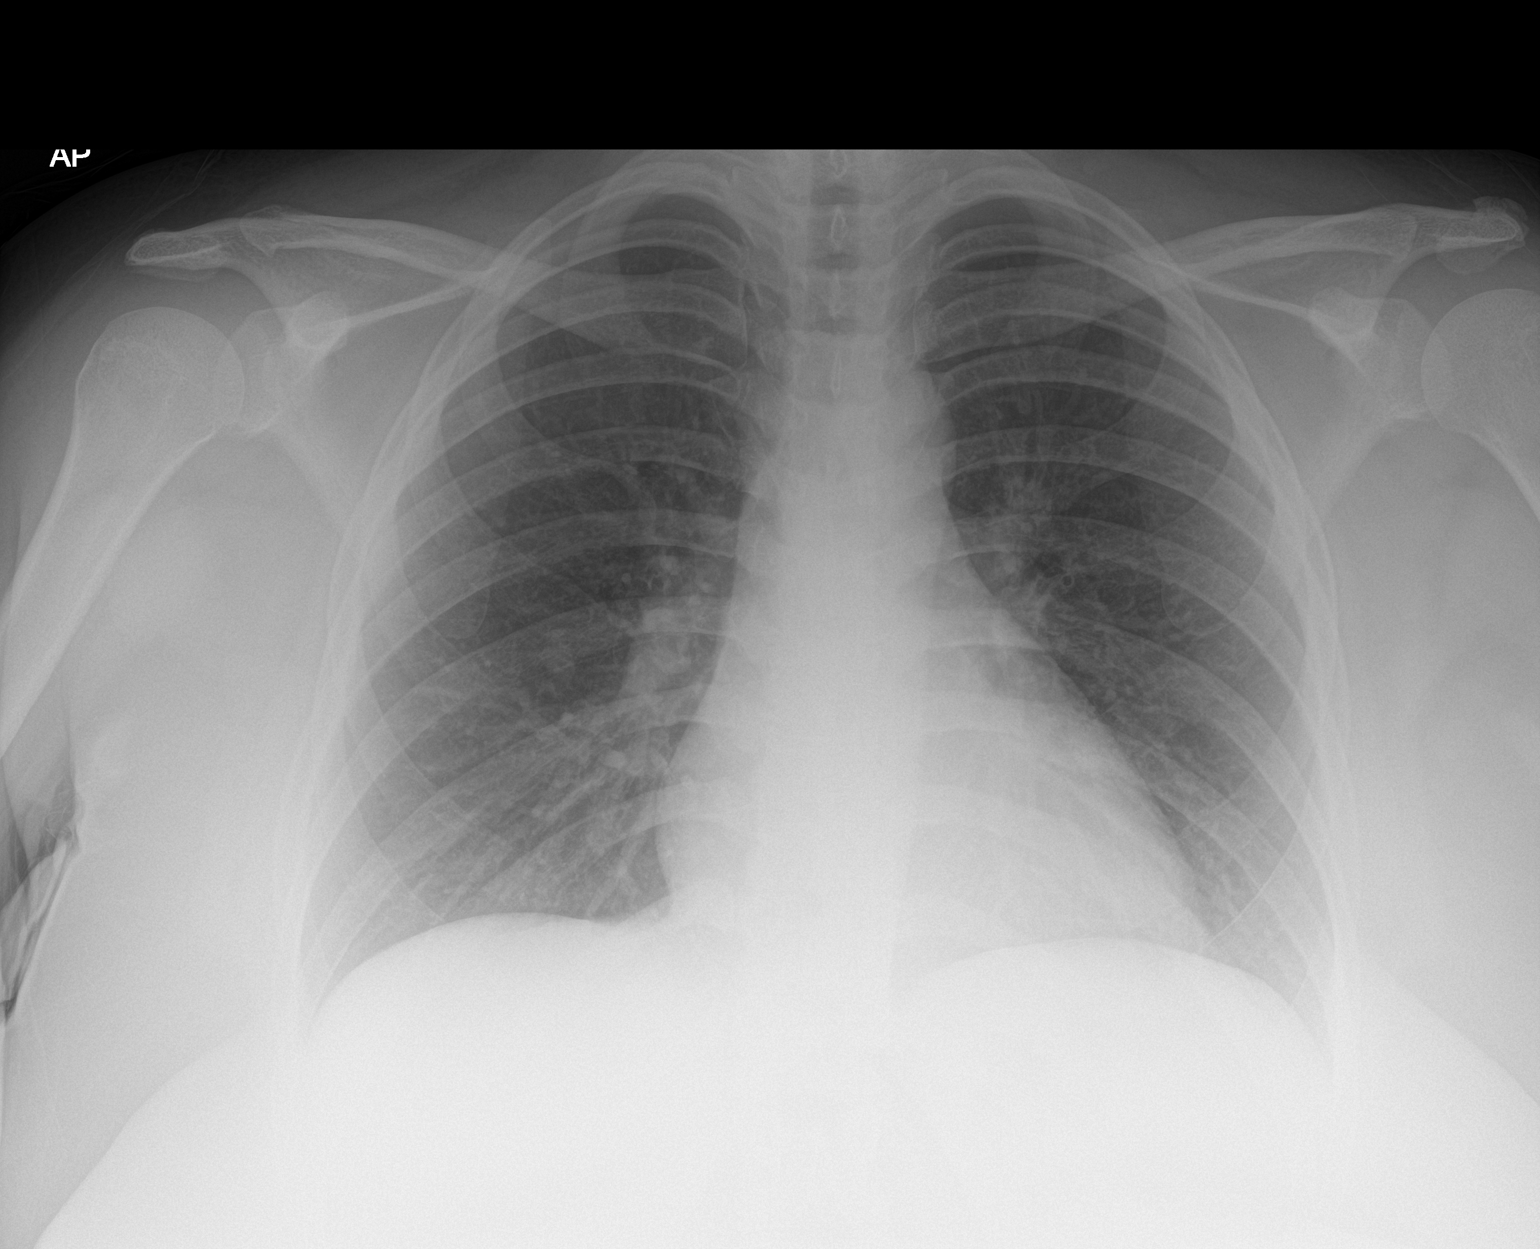

[1 of 1 positions shown; findings below may reference images not displayed]

FINDINGS: The heart size and mediastinal contours are within normal limits.
Both lungs are clear. The visualized skeletal structures are
unremarkable.
IMPRESSION: No active disease.

## 2023-02-20 ENCOUNTER — Encounter: Payer: Self-pay | Admitting: Emergency Medicine

## 2023-02-20 ENCOUNTER — Ambulatory Visit
Admission: EM | Admit: 2023-02-20 | Discharge: 2023-02-20 | Disposition: A | Discharge status: Home / Self Care | Payer: Commercial Managed Care - HMO | Attending: Family Medicine | Admitting: Family Medicine

## 2023-02-20 DIAGNOSIS — N898 Other specified noninflammatory disorders of vagina: Secondary | ICD-10-CM | POA: Diagnosis present

## 2023-02-20 DIAGNOSIS — Z3202 Encounter for pregnancy test, result negative: Secondary | ICD-10-CM | POA: Diagnosis not present

## 2023-02-20 DIAGNOSIS — L02411 Cutaneous abscess of right axilla: Secondary | ICD-10-CM | POA: Diagnosis present

## 2023-02-20 DIAGNOSIS — R1909 Other intra-abdominal and pelvic swelling, mass and lump: Secondary | ICD-10-CM | POA: Diagnosis present

## 2023-02-20 DIAGNOSIS — N390 Urinary tract infection, site not specified: Secondary | ICD-10-CM | POA: Insufficient documentation

## 2023-02-20 LAB — POCT URINALYSIS DIP (MANUAL ENTRY)
Bilirubin, UA: NEGATIVE
Glucose, UA: NEGATIVE mg/dL
Ketones, POC UA: NEGATIVE mg/dL
Nitrite, UA: POSITIVE — AB
Protein Ur, POC: 30 mg/dL — AB
Spec Grav, UA: 1.025 (ref 1.010–1.025)
Urobilinogen, UA: 0.2 U/dL
pH, UA: 7 (ref 5.0–8.0)

## 2023-02-20 LAB — POCT URINE PREGNANCY: Preg Test, Ur: NEGATIVE

## 2023-02-20 MED ORDER — CEPHALEXIN 500 MG PO CAPS: 500.0000 mg | ORAL_CAPSULE | Freq: Two times a day (BID) | ORAL | 0 refills | Status: AC

## 2023-02-20 MED ORDER — CHLORHEXIDINE GLUCONATE 4 % EX SOLN: Freq: Every day | CUTANEOUS | 0 refills | Status: AC | PRN

## 2023-02-20 NOTE — ED Provider Notes (Signed)
 [facility]    CSN: [id number] Arrival date & time: 02/20/23  1444      History   Chief Complaint Chief Complaint  Patient presents with   Groin Swelling   Cyst    HPI Alicia Simon is a 28 y.o. female.   Patient presenting today with 5-day history of vaginal pain, dysuria, suprapubic tenderness, vaginal discharge.  States symptoms started after intercourse.  Denies fever, chills, nausea, vomiting, rashes or lesions.  So far not tried anything over-the-counter for symptoms.  Requesting STD testing.  She is also having a boil to the right axillary area for the past 3 days.  She has a history of recurrent infections in this area.  Denies drainage, bleeding, fevers.  Not tried anything over-the-counter for symptoms.    Past Medical History:  Diagnosis Date   Hypertension     There are no active problems to display for this patient.   Past Surgical History:  Procedure Laterality Date   WISDOM TOOTH EXTRACTION      OB History     Gravida  2   Para  2   Term  2   Preterm  0   AB  0   Living  2      SAB  0   IAB  0   Ectopic  0   Multiple  0   Live Births  2            Home Medications    Prior to Admission medications   Medication Sig Start Date End Date Taking? Authorizing Provider  cephALEXin  (KEFLEX ) 500 MG capsule Take 1 capsule (500 mg total) by mouth 2 (two) times daily. 02/20/23  Yes Stuart Vernell Norris, PA-C  chlorhexidine  (HIBICLENS ) 4 % external liquid Apply topically daily as needed. 02/20/23  Yes Stuart Vernell Norris, PA-C  benzonatate  (TESSALON ) 100 MG capsule Take 1 capsule (100 mg total) by mouth every 8 (eight) hours. Patient not taking: Reported on 02/20/2023 12/10/20   Leotis Sole, PA-C  cyclobenzaprine  (FLEXERIL ) 10 MG tablet Take 1 tablet (10 mg total) by mouth 2 (two) times daily as needed for muscle spasms. Patient not taking: Reported on 02/20/2023 11/03/18   Robinson, [place] N, PA-C  fluticasone  (FLONASE ) 50  MCG/ACT nasal spray Place 1 spray into both nostrils daily. Patient not taking: Reported on 02/20/2023 12/10/20   Khatri, Hina, PA-C  HYDROcodone -acetaminophen  (NORCO) 5-325 MG tablet Take 1 tablet by mouth every 4 (four) hours as needed. Patient not taking: Reported on 11/03/2018 04/14/16   Lorriane Holmes, MD  ibuprofen  (ADVIL ,MOTRIN ) 600 MG tablet Take 1 tablet (600 mg total) by mouth every 6 (six) hours as needed. Patient not taking: Reported on 11/03/2018 02/27/16   Claudene, [place] , CNM  naproxen  (NAPROSYN ) 500 MG tablet Take 1 tablet (500 mg total) by mouth 2 (two) times daily. Patient not taking: Reported on 02/20/2023 09/19/20   Beverley Leita LABOR, PA-C  naproxen  sodium (ANAPROX ) 220 MG tablet Take 220 mg by mouth 2 (two) times daily as needed (pain, headache). Patient not taking: Reported on 02/20/2023    [provider]  naproxen  sodium (ANAPROX ) 550 MG tablet take 1 tablet (550 mg) by oral route 2 times per day in the morning and evening Patient not taking: Reported on 02/20/2023 10/20/20   [provider]  ondansetron  (ZOFRAN  ODT) 4 MG disintegrating tablet Take 1 tablet (4 mg total) by mouth every 8 (eight) hours as needed for nausea or vomiting. Patient not taking: Reported  on 02/20/2023 12/10/20   Khatri, Hina, PA-C  phenazopyridine  (PYRIDIUM ) 200 MG tablet TAKE 1 TABLET(200 MG) BY MOUTH THREE TIMES DAILY AS NEEDED FOR PAIN Patient not taking: Reported on 04/14/2016 02/27/16   Claudene, [place] , CNM    Family History History reviewed. No pertinent family history.  Social History Social History   Tobacco Use   Smoking status: Every Day    Current packs/day: 0.25    Average packs/day: 0.3 packs/day for 0.5 years (0.1 ttl pk-yrs)    Types: Cigarettes   Smokeless tobacco: Never  Vaping Use   Vaping status: Never Used  Substance Use Topics   Alcohol use: No   Drug use: No     Allergies   Patient has no known allergies.   Review of Systems Review of Systems Per  HPI  Physical Exam Triage Vital Signs ED Triage Vitals [02/20/23 1529]  Encounter Vitals Group     BP 131/82     Systolic BP Percentile      Diastolic BP Percentile      Pulse Rate 90     Resp 16     Temp 97.9 F (36.6 C)     Temp Source Oral     SpO2 96 %     Weight      Height      Head Circumference      Peak Flow      Pain Score 8     Pain Loc      Pain Education      Exclude from Growth Chart    No data found.  Updated Vital Signs BP 131/82 (BP Location: Left Arm)   Pulse 90   Temp 97.9 F (36.6 C) (Oral)   Resp 16   LMP 02/01/2023 (Exact Date)   SpO2 96%   Visual Acuity Right Eye Distance:   Left Eye Distance:   Bilateral Distance:    Right Eye Near:   Left Eye Near:    Bilateral Near:     Physical Exam Vitals and nursing note reviewed.  Constitutional:      Appearance: Normal appearance. She is not ill-appearing.  HENT:     Head: Atraumatic.     Mouth/Throat:     Mouth: Mucous membranes are moist.  Eyes:     Extraocular Movements: Extraocular movements intact.     Conjunctiva/sclera: Conjunctivae normal.  Cardiovascular:     Rate and Rhythm: Normal rate and regular rhythm.     Heart sounds: Normal heart sounds.  Pulmonary:     Effort: Pulmonary effort is normal.     Breath sounds: Normal breath sounds.  Abdominal:     General: Bowel sounds are normal. There is no distension.     Palpations: Abdomen is soft.     Tenderness: There is no abdominal tenderness. There is no right CVA tenderness, left CVA tenderness or guarding.  Genitourinary:    Comments: GU exam deferred, self swab performed Musculoskeletal:        General: Normal range of motion.     Cervical back: Normal range of motion and neck supple.  Skin:    General: Skin is warm and dry.     Comments: Area of tenderness to palpation to the right axillary region, significant scarring from hidradenitis to this area.  No fluctuance, induration, redness, drainage  Neurological:      Mental Status: She is alert and oriented to person, place, and time.  Psychiatric:        Mood  and Affect: Mood normal.        Thought Content: Thought content normal.        Judgment: Judgment normal.    UC Treatments / Results  Labs (all labs ordered are listed, but only abnormal results are displayed) Labs Reviewed  POCT URINALYSIS DIP (MANUAL ENTRY) - Abnormal; Notable for the following components:      Result Value   Color, UA other (*)    Clarity, UA cloudy (*)    Blood, UA trace-intact (*)    Protein Ur, POC =30 (*)    Nitrite, UA Positive (*)    Leukocytes, UA Small (1+) (*)    All other components within normal limits  POCT URINE PREGNANCY - Normal  URINE CULTURE  CERVICOVAGINAL ANCILLARY ONLY    EKG   Radiology No results found.  Procedures Procedures (including critical care time)  Medications Ordered in UC Medications - No data to display  Initial Impression / Assessment and Plan / UC Course  I have reviewed the triage vital signs and the nursing notes.  Pertinent labs & imaging results that were available during my care of the patient were reviewed by me and considered in my medical decision making (see chart for details).     Urinalysis today with evidence of urinary tract infection, urine culture pending.  Urine pregnancy negative.  Will treat with Keflex  to cover both the hidradenitis flare as well as a urinary tract infection, await urine culture and adjust if needed.  Vaginal swab also pending for STD and vaginitis workup.  Hibiclens  for prevention going forward of hidradenitis flares.  Final Clinical Impressions(s) / UC Diagnoses   Final diagnoses:  Abscess of right axilla  Acute lower UTI  Vaginal discharge     Discharge Instructions      I have sent an antibiotic that should cover for both the underarm infection as well as the urinary tract infection.  I have sent over a wound cleaner called Hibiclens  that you may wash 2-3 times a week  the areas such as the underarms or other areas that you frequently get boils.  If you use it too frequently it can cause dry skin but several times a week is sometimes helpful for prevention of boils.  Your urine pregnancy test was negative today and your vaginal swab is pending.  We will let you know if anything comes back positive on your vaginal swab or your urine culture tells us  that we need to change your medication    ED Prescriptions     Medication Sig Dispense Auth. Provider   cephALEXin  (KEFLEX ) 500 MG capsule Take 1 capsule (500 mg total) by mouth 2 (two) times daily. 14 capsule Stuart Vernell Norris, PA-C   chlorhexidine  (HIBICLENS ) 4 % external liquid Apply topically daily as needed. 236 mL Stuart Vernell Norris, [place]      PDMP not reviewed this encounter.   Stuart Vernell Norris, [place] 02/20/23 [phone]

## 2023-02-20 NOTE — Discharge Instructions (Addendum)
 I have sent an antibiotic that should cover for both the underarm infection as well as the urinary tract infection.  I have sent over a wound cleaner called Hibiclens  that you may wash 2-3 times a week the areas such as the underarms or other areas that you frequently get boils.  If you use it too frequently it can cause dry skin but several times a week is sometimes helpful for prevention of boils.  Your urine pregnancy test was negative today and your vaginal swab is pending.  We will let you know if anything comes back positive on your vaginal swab or your urine culture tells us  that we need to change your medication

## 2023-02-20 NOTE — ED Triage Notes (Addendum)
 Pt has two separate unrelated concerns: boil to R axillary area x3 days. Pt has a recurrent hx of boils to the area that usually resolve on their own but this one has been particularly painful.   Pt also complains of vaginal pain and slight swelling x5 days after intercourse. Reports pain with urination. Notes lower abdominal pain yesterday but none today. Would like STD testing to be done but no blood work.

## 2023-02-21 LAB — CERVICOVAGINAL ANCILLARY ONLY
Bacterial Vaginitis (gardnerella): POSITIVE — AB
Candida Glabrata: NEGATIVE
Candida Vaginitis: NEGATIVE
Chlamydia: NEGATIVE
Comment: NEGATIVE
Comment: NEGATIVE
Comment: NEGATIVE
Comment: NEGATIVE
Comment: NEGATIVE
Comment: NORMAL
Neisseria Gonorrhea: NEGATIVE
Trichomonas: NEGATIVE

## 2023-02-22 LAB — URINE CULTURE: Culture: >=100000 — AB

## 2023-02-24 ENCOUNTER — Telehealth (HOSPITAL_BASED_OUTPATIENT_CLINIC_OR_DEPARTMENT_OTHER): Payer: Self-pay

## 2023-02-24 MED ORDER — METRONIDAZOLE 500 MG PO TABS: 500.0000 mg | ORAL_TABLET | Freq: Two times a day (BID) | ORAL | 0 refills | Status: AC

## 2023-02-24 NOTE — Telephone Encounter (Signed)
 Per protocol, pt requires tx with metronidazole. Attempted to reach patient x1. VM full.  Rx sent to pharmacy on file.

## 2023-11-11 ENCOUNTER — Ambulatory Visit: Payer: MEDICAID | Admitting: Allergy
